# Patient Record
Sex: Female | Born: 1950
Health system: Southern US, Community
[De-identification: ages and names within clinical notes are randomized; demographics above are authoritative.]

## PROBLEM LIST (undated history)

## (undated) DIAGNOSIS — J189 Pneumonia, unspecified organism: Secondary | ICD-10-CM

## (undated) HISTORY — DX: Pneumonia, unspecified organism: J18.9

## (undated) HISTORY — PX: TONSILLECTOMY: SUR1361

---

## 1999-07-04 ENCOUNTER — Encounter (INDEPENDENT_AMBULATORY_CARE_PROVIDER_SITE_OTHER): Payer: Self-pay | Admitting: Specialist

## 1999-07-04 ENCOUNTER — Other Ambulatory Visit: Admission: RE | Admit: 1999-07-04 | Discharge: 1999-07-04 | Payer: Self-pay | Admitting: Obstetrics and Gynecology

## 2000-05-02 ENCOUNTER — Encounter: Payer: Self-pay | Admitting: Obstetrics and Gynecology

## 2000-05-02 ENCOUNTER — Ambulatory Visit (HOSPITAL_COMMUNITY): Admission: RE | Admit: 2000-05-02 | Discharge: 2000-05-02 | Payer: Self-pay | Admitting: Obstetrics and Gynecology

## 2000-05-07 ENCOUNTER — Encounter: Payer: Self-pay | Admitting: Obstetrics and Gynecology

## 2000-05-07 ENCOUNTER — Encounter: Admission: RE | Admit: 2000-05-07 | Discharge: 2000-05-07 | Payer: Self-pay | Admitting: Obstetrics and Gynecology

## 2004-03-14 ENCOUNTER — Ambulatory Visit (HOSPITAL_COMMUNITY): Admission: RE | Admit: 2004-03-14 | Discharge: 2004-03-14 | Payer: Self-pay | Admitting: Gastroenterology

## 2004-03-14 ENCOUNTER — Encounter (INDEPENDENT_AMBULATORY_CARE_PROVIDER_SITE_OTHER): Payer: Self-pay | Admitting: Specialist

## 2006-08-17 ENCOUNTER — Emergency Department (HOSPITAL_COMMUNITY): Admission: EM | Admit: 2006-08-17 | Discharge: 2006-08-17 | Payer: Self-pay | Admitting: Emergency Medicine

## 2007-04-26 ENCOUNTER — Emergency Department (HOSPITAL_COMMUNITY): Admission: EM | Admit: 2007-04-26 | Discharge: 2007-04-26 | Payer: Self-pay | Admitting: Emergency Medicine

## 2008-09-06 ENCOUNTER — Encounter: Payer: Self-pay | Admitting: Sports Medicine

## 2008-09-06 ENCOUNTER — Ambulatory Visit: Payer: Self-pay | Admitting: Sports Medicine

## 2008-09-06 DIAGNOSIS — IMO0002 Reserved for concepts with insufficient information to code with codable children: Secondary | ICD-10-CM | POA: Insufficient documentation

## 2009-05-11 ENCOUNTER — Emergency Department (HOSPITAL_COMMUNITY): Admission: EM | Admit: 2009-05-11 | Discharge: 2009-05-11 | Payer: Self-pay | Admitting: Family Medicine

## 2010-08-03 ENCOUNTER — Ambulatory Visit (HOSPITAL_COMMUNITY)
Admission: RE | Admit: 2010-08-03 | Discharge: 2010-08-03 | Payer: Self-pay | Source: Home / Self Care | Attending: Obstetrics and Gynecology | Admitting: Obstetrics and Gynecology

## 2011-01-05 NOTE — Op Note (Signed)
NAME:  Molly Sanders                         ACCOUNT NO.:  [id number]   MEDICAL RECORD NO.:  [id number]                   PATIENT TYPE:  AMB   LOCATION:  ENDO                                 FACILITY:  [facility]   PHYSICIAN:  John C. Madilyn Fireman, M.D.                 DATE OF BIRTH:  06-Nov-1950   DATE OF PROCEDURE:  03/14/2004  DATE OF DISCHARGE:                                 OPERATIVE REPORT   PROCEDURE:  Colonoscopy with polypectomy.   INDICATIONS FOR PROCEDURE:  Average risk colon cancer screening in a 60-year-  old patient with no previous screening.   PROCEDURE:  The patient was placed in the left lateral decubitus position  and placed on the pulse monitor with continuous low flow oxygen delivered by  nasal cannula.  She was sedated with 70 mcg of IV fentanyl, 9 mg of IV  Versed.  The Olympus video colonoscope was inserted into the rectum and  advanced to the cecum, confirmed by transillumination of McBurney's point  and visualization of the ileocecal valve and the appendiceal orifice.  The  prep was excellent.  The cecum, ascending, and transverse colon appeared  normal with no masses, polyps, diverticula or other mucosal abnormalities.  Within the descending colon, there was a 1 cm polyp on a short stalk which  was removed by snare.  The remainder of the descending, sigmoid and rectum  appeared normal down to the anus where retroflexed view revealed no obvious  internal hemorrhoids.  The scope was then withdrawn and the patient returned  to the recovery room in stable condition.  She tolerated the procedure well  and there were no immediate complications.   IMPRESSION:  Descending colon polyp otherwise normal study.   PLAN:  Await histology to determine method in interval for future colon  screening.                                               John C. Madilyn Fireman, M.D.    J[facility]/MEDQ  D:  03/14/2004  T:  03/14/2004  Job:  [id number]   cc:   Otilio Connors. Gerri Spore, M.D.  [address]  [place]  [place] [address]  Fax: [phone]

## 2011-06-01 LAB — POCT PREGNANCY, URINE
Operator id: 235561
Preg Test, Ur: NEGATIVE

## 2011-06-01 LAB — POCT URINALYSIS DIP (DEVICE)
Ketones, ur: NEGATIVE
Nitrite: NEGATIVE
Operator id: 235561
Protein, ur: NEGATIVE
Urobilinogen, UA: 0.2
pH: 7

## 2011-07-04 ENCOUNTER — Encounter: Payer: Self-pay | Admitting: *Deleted

## 2011-07-04 ENCOUNTER — Emergency Department (INDEPENDENT_AMBULATORY_CARE_PROVIDER_SITE_OTHER)
Admission: EM | Admit: 2011-07-04 | Discharge: 2011-07-04 | Disposition: A | Discharge status: Home / Self Care | Payer: 59 | Source: Home / Self Care | Attending: Family Medicine | Admitting: Family Medicine

## 2011-07-04 DIAGNOSIS — J069 Acute upper respiratory infection, unspecified: Secondary | ICD-10-CM

## 2011-07-04 MED ORDER — AZITHROMYCIN 250 MG PO TABS: ORAL_TABLET | ORAL | Status: AC

## 2011-07-04 NOTE — ED Provider Notes (Signed)
History     CSN: [id number] Arrival date & time: 07/04/2011  7:32 PM   First MD Initiated Contact with Patient 07/04/11 1929      Chief Complaint  Patient presents with  . Sore Throat    (Consider location/radiation/quality/duration/timing/severity/associated sxs/prior treatment) Patient is a 60 y.o. female presenting with pharyngitis. The history is provided by the patient.  Sore Throat This is a new problem. The current episode started more than 1 week ago. The problem occurs constantly. The problem has not changed since onset.Pertinent negatives include no shortness of breath. The symptoms are aggravated by swallowing and coughing.    History reviewed. No pertinent past medical history.  Past Surgical History  Procedure Date  . Tonsillectomy     Family History  Problem Relation Age of Onset  . Cancer Father     History  Substance Use Topics  . Smoking status: Never Smoker   . Smokeless tobacco: Not on file  . Alcohol Use: Yes     social    OB History    Grav Para Term Preterm Abortions TAB SAB Ect Mult Living                  Review of Systems  Constitutional: Negative.   HENT: Positive for congestion and rhinorrhea.   Eyes: Negative.   Respiratory: Positive for cough. Negative for shortness of breath.   Cardiovascular: Negative.     Allergies  Sulfur  Home Medications  No current outpatient prescriptions on file.  BP 130/60  Pulse 75  Temp(Src) 98.2 F (36.8 C) (Oral)  Resp 16  SpO2 100%  Physical Exam  Constitutional: She appears well-developed and well-nourished.  HENT:  Head: Normocephalic and atraumatic.  Right Ear: External ear normal.  Eyes: Conjunctivae and EOM are normal. Pupils are equal, round, and reactive to light.  Neck: Normal range of motion. Neck supple.  Cardiovascular: Normal rate, regular rhythm and normal heart sounds.   Pulmonary/Chest: Effort normal and breath sounds normal. No respiratory distress. She has no  wheezes. She has no rales.  Skin: Skin is warm and dry.    ED Course  Procedures (including critical care time)  Labs Reviewed - No data to display No results found.   No diagnosis found.    MDM          Barkley Bruns, MD 07/04/11 [phone]

## 2011-07-04 NOTE — ED Notes (Signed)
Reports  Symptoms  Of  sorethroat  Chills  Congested  Body  Aches  As  Well  As  Losing  Her  Voice   Symptoms  X  5  Days

## 2011-07-06 NOTE — ED Notes (Signed)
Call from patient, requesting Rx for yeast. Dr Artis Flock authorized diflucan 150 mg x 1 and MR in 4-5 days if no improvement; Called in to [place], [employer] at patient request, patient notified

## 2012-09-14 ENCOUNTER — Inpatient Hospital Stay (HOSPITAL_COMMUNITY)
Admission: EM | Admit: 2012-09-14 | Discharge: 2012-09-17 | DRG: 871 | Disposition: A | Discharge status: Home / Self Care | Payer: 59 | Attending: Internal Medicine | Admitting: Internal Medicine

## 2012-09-14 ENCOUNTER — Encounter (HOSPITAL_COMMUNITY): Payer: Self-pay | Admitting: *Deleted

## 2012-09-14 ENCOUNTER — Emergency Department (HOSPITAL_COMMUNITY): Payer: 59

## 2012-09-14 ENCOUNTER — Emergency Department (HOSPITAL_COMMUNITY)
Admission: EM | Admit: 2012-09-14 | Discharge: 2012-09-14 | Disposition: A | Discharge status: Nursing Home (Includes ICF) | Payer: 59 | Source: Home / Self Care

## 2012-09-14 DIAGNOSIS — J9601 Acute respiratory failure with hypoxia: Secondary | ICD-10-CM | POA: Diagnosis present

## 2012-09-14 DIAGNOSIS — R6520 Severe sepsis without septic shock: Secondary | ICD-10-CM | POA: Diagnosis present

## 2012-09-14 DIAGNOSIS — A419 Sepsis, unspecified organism: Principal | ICD-10-CM

## 2012-09-14 DIAGNOSIS — R6510 Systemic inflammatory response syndrome (SIRS) of non-infectious origin without acute organ dysfunction: Secondary | ICD-10-CM | POA: Diagnosis present

## 2012-09-14 DIAGNOSIS — R000 Tachycardia, unspecified: Secondary | ICD-10-CM

## 2012-09-14 DIAGNOSIS — R0600 Dyspnea, unspecified: Secondary | ICD-10-CM

## 2012-09-14 DIAGNOSIS — R509 Fever, unspecified: Secondary | ICD-10-CM

## 2012-09-14 DIAGNOSIS — R0902 Hypoxemia: Secondary | ICD-10-CM

## 2012-09-14 DIAGNOSIS — IMO0002 Reserved for concepts with insufficient information to code with codable children: Secondary | ICD-10-CM

## 2012-09-14 DIAGNOSIS — J9600 Acute respiratory failure, unspecified whether with hypoxia or hypercapnia: Secondary | ICD-10-CM | POA: Diagnosis present

## 2012-09-14 DIAGNOSIS — E876 Hypokalemia: Secondary | ICD-10-CM | POA: Diagnosis present

## 2012-09-14 DIAGNOSIS — E873 Alkalosis: Secondary | ICD-10-CM

## 2012-09-14 DIAGNOSIS — R0989 Other specified symptoms and signs involving the circulatory and respiratory systems: Secondary | ICD-10-CM

## 2012-09-14 DIAGNOSIS — R0609 Other forms of dyspnea: Secondary | ICD-10-CM

## 2012-09-14 DIAGNOSIS — R0689 Other abnormalities of breathing: Secondary | ICD-10-CM

## 2012-09-14 DIAGNOSIS — J189 Pneumonia, unspecified organism: Secondary | ICD-10-CM | POA: Diagnosis present

## 2012-09-14 LAB — CBC WITH DIFFERENTIAL/PLATELET
Basophils Absolute: 0 10*3/uL (ref 0.0–0.1)
Basophils Relative: 0 % (ref 0–1)
Eosinophils Relative: 0 % (ref 0–5)
Lymphocytes Relative: 7 % — ABNORMAL LOW (ref 12–46)
MCHC: 34.1 g/dL (ref 30.0–36.0)
Neutro Abs: 10.1 10*3/uL — ABNORMAL HIGH (ref 1.7–7.7)
Platelets: 141 10*3/uL — ABNORMAL LOW (ref 150–400)
RDW: 13 % (ref 11.5–15.5)
WBC: 12.7 10*3/uL — ABNORMAL HIGH (ref 4.0–10.5)

## 2012-09-14 LAB — URINALYSIS, ROUTINE W REFLEX MICROSCOPIC
Bilirubin Urine: NEGATIVE
Hgb urine dipstick: NEGATIVE
Nitrite: NEGATIVE
Protein, ur: NEGATIVE mg/dL
Specific Gravity, Urine: 1.007 (ref 1.005–1.030)
Urobilinogen, UA: 0.2 mg/dL (ref 0.0–1.0)

## 2012-09-14 LAB — POCT RAPID STREP A: Streptococcus, Group A Screen (Direct): NEGATIVE

## 2012-09-14 LAB — BASIC METABOLIC PANEL
BUN: 8 mg/dL (ref 6–23)
Calcium: 8.2 mg/dL — ABNORMAL LOW (ref 8.4–10.5)
Creatinine, Ser: 0.59 mg/dL (ref 0.50–1.10)
GFR calc Af Amer: >90 mL/min (ref >90)
GFR calc non Af Amer: >90 mL/min (ref >90)

## 2012-09-14 LAB — POCT I-STAT 3, VENOUS BLOOD GAS (G3P V)
Acid-Base Excess: 4 mmol/L — ABNORMAL HIGH (ref 0.0–2.0)
Bicarbonate: 25.6 mEq/L — ABNORMAL HIGH (ref 20.0–24.0)
TCO2: 26 mmol/L (ref 0–100)
pH, Ven: 7.542 — ABNORMAL HIGH (ref 7.250–7.300)

## 2012-09-14 LAB — URINE MICROSCOPIC-ADD ON

## 2012-09-14 LAB — TROPONIN I: Troponin I: <0.3 ng/mL (ref <0.30)

## 2012-09-14 MED ORDER — SODIUM CHLORIDE 0.9 % IV SOLN
Freq: Once | INTRAVENOUS | Status: AC
  Administered 2012-09-14: 17:00:00 via INTRAVENOUS

## 2012-09-14 MED ORDER — MAGNESIUM SULFATE 40 MG/ML IJ SOLN
2.0000 g | Freq: Once | INTRAMUSCULAR | Status: AC
  Administered 2012-09-14: 2 g via INTRAVENOUS
  Filled 2012-09-14: qty 50

## 2012-09-14 MED ORDER — AZITHROMYCIN 250 MG PO TABS
500.0000 mg | ORAL_TABLET | Freq: Once | ORAL | Status: AC
  Administered 2012-09-14: 500 mg via ORAL
  Filled 2012-09-14: qty 2

## 2012-09-14 MED ORDER — ACETAMINOPHEN 325 MG PO TABS
650.0000 mg | ORAL_TABLET | Freq: Once | ORAL | Status: AC
  Administered 2012-09-14: 650 mg via ORAL
  Filled 2012-09-14: qty 2

## 2012-09-14 MED ORDER — POTASSIUM CHLORIDE CRYS ER 20 MEQ PO TBCR
60.0000 meq | EXTENDED_RELEASE_TABLET | Freq: Once | ORAL | Status: AC
  Administered 2012-09-14: 60 meq via ORAL
  Filled 2012-09-14: qty 3

## 2012-09-14 MED ORDER — ACETAMINOPHEN 325 MG PO TABS
650.0000 mg | ORAL_TABLET | Freq: Once | ORAL | Status: AC
  Administered 2012-09-14: 650 mg via ORAL

## 2012-09-14 MED ORDER — ACETAMINOPHEN 325 MG PO TABS
ORAL_TABLET | ORAL | Status: AC
  Filled 2012-09-14: qty 2

## 2012-09-14 MED ORDER — DEXTROSE 5 % IV SOLN
1.0000 g | Freq: Once | INTRAVENOUS | Status: AC
  Administered 2012-09-14: 1 g via INTRAVENOUS
  Filled 2012-09-14: qty 10

## 2012-09-14 MED ORDER — SODIUM CHLORIDE 0.9 % IV BOLUS (SEPSIS)
2000.0000 mL | Freq: Once | INTRAVENOUS | Status: AC
  Administered 2012-09-14: 2000 mL via INTRAVENOUS

## 2012-09-14 NOTE — ED Provider Notes (Signed)
History     CSN: [id number]  Arrival date & time 09/14/12  1750   First MD Initiated Contact with Patient 09/14/12 1826      Chief Complaint  Patient presents with  . Fever  . Tachycardia    (Consider location/radiation/quality/duration/timing/severity/associated sxs/prior treatment) Patient is a 62 y.o. female presenting with fever.  Fever Primary symptoms of the febrile illness include fever, cough and shortness of breath. Primary symptoms do not include wheezing, abdominal pain, nausea, vomiting or diarrhea.    History reviewed. No pertinent past medical history.  Past Surgical History  Procedure Date  . Tonsillectomy     Family History  Problem Relation Age of Onset  . Cancer Father     History  Substance Use Topics  . Smoking status: Never Smoker   . Smokeless tobacco: Not on file  . Alcohol Use: Yes     Comment: social    OB History    Grav Para Term Preterm Abortions TAB SAB Ect Mult Living                  Review of Systems  Constitutional: Positive for fever. Negative for activity change and unexpected weight change.  HENT: Negative for facial swelling and neck pain.   Respiratory: Positive for cough and shortness of breath. Negative for wheezing.   Cardiovascular: Negative for chest pain.  Gastrointestinal: Negative for nausea, vomiting, abdominal pain, diarrhea, constipation, blood in stool and abdominal distention.  Genitourinary: Negative for hematuria and difficulty urinating.  Skin: Negative for color change.  Neurological: Negative for speech difficulty.  Hematological: Does not bruise/bleed easily.  Psychiatric/Behavioral: Negative for confusion.    Allergies  Sulfur  Home Medications   Current Outpatient Rx  Name  Route  Sig  Dispense  Refill  . JUICE PLUS FIBRE PO   Oral   Take 1 tablet by mouth daily.           BP 128/62  Pulse 102  Temp 99.8 F (37.7 C) (Oral)  Resp 18  SpO2 93%  Physical Exam  Nursing note and  vitals reviewed. Constitutional: She is oriented to person, place, and time. She appears well-developed.  HENT:  Head: Normocephalic and atraumatic.  Eyes: Conjunctivae normal and EOM are normal. Pupils are equal, round, and reactive to light.  Neck: Normal range of motion. Neck supple. No JVD present.  Cardiovascular: Normal rate, regular rhythm and normal heart sounds.   Pulmonary/Chest: Effort normal and breath sounds normal. No respiratory distress.  Abdominal: Soft. Bowel sounds are normal. She exhibits no distension. There is no tenderness. There is no rebound and no guarding.  Neurological: She is alert and oriented to person, place, and time.  Skin: Skin is warm and dry.    ED Course  Procedures (including critical care time)  Labs Reviewed  BASIC METABOLIC PANEL - Abnormal; Notable for the following:    Potassium 2.8 (*)     Glucose, Bld 149 (*)     Calcium 8.2 (*)     All other components within normal limits  CBC WITH DIFFERENTIAL - Abnormal; Notable for the following:    WBC 12.7 (*)     Platelets 141 (*)     Neutrophils Relative 79 (*)     Neutro Abs 10.1 (*)     Lymphocytes Relative 7 (*)     Monocytes Relative 14 (*)     Monocytes Absolute 1.8 (*)     All other components within normal limits  URINALYSIS,  ROUTINE W REFLEX MICROSCOPIC - Abnormal; Notable for the following:    Leukocytes, UA TRACE (*)     All other components within normal limits  POCT I-STAT 3, BLOOD GAS (G3P V) - Abnormal; Notable for the following:    pH, Ven 7.542 (*)     pCO2, Ven 29.8 (*)     pO2, Ven 149.0 (*)     Bicarbonate 25.6 (*)     Acid-Base Excess 4.0 (*)     All other components within normal limits  TROPONIN I  LACTIC ACID, PLASMA  URINE MICROSCOPIC-ADD ON  URINE CULTURE   Dg Chest 2 View  09/14/2012  *RADIOLOGY REPORT*  Clinical Data: 62 year old female with chest pain, cough and fever.  CHEST - 2 VIEW  Comparison: None  Findings: The cardiomediastinal silhouette is  unremarkable. Left lower lobe opacity is suspicious for pneumonia. There is no evidence of pleural effusion, pneumothorax, pulmonary edema, or mass. No acute or suspicious bony abnormalities are present.  IMPRESSION: Left lower lobe opacity suspicious for pneumonia.  Radiographic follow up to resolution recommended.   Original Report Authenticated By: Harmon Pier, M.D.      No diagnosis found.    MDM   Date: 09/14/2012  Rate: 95  Rhythm: normal sinus rhythm  QRS Axis: normal  Intervals: normal  ST/T Wave abnormalities: normal  Conduction Disutrbances:none  Narrative Interpretation:   Old EKG Reviewed: none available - U WAVES SEEN.  Pt comes in with cc of cough, fevers. Pt has 4/4 SIRS criteria initially. CXR shows possible infiltrate on the left side and she is slightly hypoxic. Sheh as no risk factors for PE, and given the fevers, cough - PE is low on the ddx, and this is considered more to be an infectious process.  Her lactate was < 2. And with hydration her HR and RR has improved. ABG was drawn by our techs - and it shows mixed metabolic and respiratory alkalosis and K is 2.4. She has u waves. No GI loss, No diuretics.  Pt is seen by Dr. Kevan Ny. Her PSI score is 61, and she is fit for outpatient therapy, especially since she has no co morbidities that are concerning.  Pt's O2 sats are 93% on room air. Pt's O2 sats 85% on ambulation.  Dr. Onalee Hua admitting.   CRITICAL CARE Performed by: Derwood Kaplan   Total critical care time: 30 minutes  Critical care time was exclusive of separately billable procedures and treating other patients.  Critical care was necessary to treat or prevent imminent or life-threatening deterioration.  Critical care was time spent personally by me on the following activities: development of treatment plan with patient and/or surrogate as well as nursing, discussions with consultants, evaluation of patient's response to treatment, examination of  patient, obtaining history from patient or surrogate, ordering and performing treatments and interventions, ordering and review of laboratory studies, ordering and review of radiographic studies, pulse oximetry and re-evaluation of patient's condition.       Derwood Kaplan, MD 09/15/12 [phone]

## 2012-09-14 NOTE — ED Notes (Addendum)
Pt placed on 2L O2 by [place] due to low spO2 (88%) when roomed.

## 2012-09-14 NOTE — ED Notes (Signed)
Patient complains of chest congestion, cough, nausea, sore throat, and shortness of breath, fatigue x 4 days. Denies vomiting, diarrhea.

## 2012-09-14 NOTE — ED Notes (Signed)
Pt. Informed of need to urinate, pt can not at this time.

## 2012-09-14 NOTE — ED Provider Notes (Signed)
History     CSN: [id number]  Arrival date & time 09/14/12  1359   First MD Initiated Contact with Patient 09/14/12 1627      Chief Complaint  Patient presents with  . URI    (Consider location/radiation/quality/duration/timing/severity/associated sxs/prior treatment) HPI Comments: 62 year old female presents with 2 days history of runny nose. Today she developed chest pain, malaise and sore throat. She is complaining of bodyaches, cough, fever. She states she has had no fluid intake today. Although she did not complain of shortness of breath she is tachypneic with a low pulse oximetry at 88% room air. Vital signs are noted.   History reviewed. No pertinent past medical history.  Past Surgical History  Procedure Date  . Tonsillectomy     Family History  Problem Relation Age of Onset  . Cancer Father     History  Substance Use Topics  . Smoking status: Never Smoker   . Smokeless tobacco: Not on file  . Alcohol Use: Yes     Comment: social    OB History    Grav Para Term Preterm Abortions TAB SAB Ect Mult Living                  Review of Systems  Constitutional: Positive for fever, chills, activity change, appetite change and fatigue.  HENT: Positive for rhinorrhea. Negative for ear pain, sore throat, neck pain and neck stiffness.   Respiratory: Positive for cough and wheezing.   Cardiovascular: Positive for chest pain.  Gastrointestinal: Negative.   Genitourinary: Negative.   Skin: Positive for pallor.  Neurological: Positive for light-headedness.  Psychiatric/Behavioral: Negative.     Allergies  Sulfur  Home Medications  No current outpatient prescriptions on file.  BP 130/57  Pulse 122  Temp 103.3 F (39.6 C) (Oral)  Resp 24  SpO2 91%  Physical Exam  Nursing note and vitals reviewed. Constitutional: She is oriented to person, place, and time. She appears well-developed and well-nourished. She appears distressed.       Patient is  extremely  weak. She appears quite ILL.  HENT:       Bilateral TMs with mild retraction otherwise normal. Oropharynx without erythema or exudates.   Eyes: EOM are normal.  Neck: Normal range of motion. Neck supple.  Cardiovascular: Regular rhythm.   No murmur heard.      Tachycardia  Pulmonary/Chest:       Mild increase in respiratory effort and tachypnea. Lungs with diffuse wheezes, rhonchi and rales.  Lymphadenopathy:    She has no cervical adenopathy.  Neurological: She is alert and oriented to person, place, and time.  Skin: Skin is warm. There is pallor.  Psychiatric: She has a normal mood and affect.    ED Course  Procedures (including critical care time)   Labs Reviewed  POCT RAPID STREP A ([facility] CARE ONLY)   No results found.   1. Hypoxia   2. Dyspnea   3. Fever and chills   4. Tachycardia   5. Abnormal breath sounds       MDM  62 year old female with fever 103.3. Tachycardia at 122, pulse ox of 88% on room air is being transported to the emergency department. Her lungs with bilateral scattered rhonchi, crackles in suspicion of lower lobe pulmonary edema. She is placed on oxygen at 2 L with a improved sat of 92%. It has since been raised to 4 L per minute. She is complaining of chest pain, malaise, bodyaches and cough. She has  had poor to no by mouth fluid intake today.         Hayden Rasmussen, NP 09/14/12 1708

## 2012-09-14 NOTE — ED Notes (Signed)
Per [facility]- pt has been having nausea, faver, headache and sorethroat since Thursday. Pt seen at urgent care today transferred due to HR in 122 and O2 sats at 88% on room air. Pt now on 2L with hr 106 and O2 at 96%. Pt had 103.3 fever and was given 650mg  of tylenol at urgent care.

## 2012-09-15 ENCOUNTER — Encounter (HOSPITAL_COMMUNITY): Payer: Self-pay | Admitting: *Deleted

## 2012-09-15 DIAGNOSIS — R6510 Systemic inflammatory response syndrome (SIRS) of non-infectious origin without acute organ dysfunction: Secondary | ICD-10-CM | POA: Diagnosis present

## 2012-09-15 DIAGNOSIS — J189 Pneumonia, unspecified organism: Secondary | ICD-10-CM | POA: Diagnosis present

## 2012-09-15 DIAGNOSIS — E876 Hypokalemia: Secondary | ICD-10-CM | POA: Diagnosis present

## 2012-09-15 DIAGNOSIS — J9601 Acute respiratory failure with hypoxia: Secondary | ICD-10-CM | POA: Diagnosis present

## 2012-09-15 DIAGNOSIS — J9600 Acute respiratory failure, unspecified whether with hypoxia or hypercapnia: Secondary | ICD-10-CM

## 2012-09-15 LAB — LEGIONELLA ANTIGEN, URINE: Legionella Antigen, Urine: NEGATIVE

## 2012-09-15 LAB — BASIC METABOLIC PANEL
BUN: 7 mg/dL (ref 6–23)
Chloride: 103 mEq/L (ref 96–112)
Creatinine, Ser: 0.48 mg/dL — ABNORMAL LOW (ref 0.50–1.10)
GFR calc Af Amer: >90 mL/min (ref >90)

## 2012-09-15 LAB — CBC WITH DIFFERENTIAL/PLATELET
Basophils Absolute: 0 10*3/uL (ref 0.0–0.1)
Basophils Relative: 0 % (ref 0–1)
Eosinophils Absolute: 0 10*3/uL (ref 0.0–0.7)
Eosinophils Relative: 0 % (ref 0–5)
HCT: 36.4 % (ref 36.0–46.0)
MCHC: 34.3 g/dL (ref 30.0–36.0)
MCV: 89.7 fL (ref 78.0–100.0)
Monocytes Absolute: 1.3 10*3/uL — ABNORMAL HIGH (ref 0.1–1.0)
Neutro Abs: 9.7 10*3/uL — ABNORMAL HIGH (ref 1.7–7.7)
RDW: 13.3 % (ref 11.5–15.5)

## 2012-09-15 LAB — CHLORIDE, URINE, RANDOM: Chloride Urine: <25 mEq/L

## 2012-09-15 LAB — URINE CULTURE: Colony Count: 50000

## 2012-09-15 MED ORDER — CEFTRIAXONE SODIUM 1 G IJ SOLR
1.0000 g | Freq: Every day | INTRAMUSCULAR | Status: DC
  Administered 2012-09-16 (×2): 1 g via INTRAVENOUS
  Filled 2012-09-15 (×3): qty 10

## 2012-09-15 MED ORDER — SODIUM CHLORIDE 0.9 % IV SOLN: INTRAVENOUS | Status: DC

## 2012-09-15 MED ORDER — IPRATROPIUM BROMIDE 0.02 % IN SOLN
0.5000 mg | Freq: Four times a day (QID) | RESPIRATORY_TRACT | Status: DC
  Administered 2012-09-15 – 2012-09-16 (×3): 0.5 mg via RESPIRATORY_TRACT
  Filled 2012-09-15 (×3): qty 2.5

## 2012-09-15 MED ORDER — ENOXAPARIN SODIUM 40 MG/0.4ML ~~LOC~~ SOLN
40.0000 mg | Freq: Every day | SUBCUTANEOUS | Status: DC
  Administered 2012-09-15 – 2012-09-16 (×2): 40 mg via SUBCUTANEOUS
  Filled 2012-09-15 (×3): qty 0.4

## 2012-09-15 MED ORDER — GUAIFENESIN-DM 100-10 MG/5ML PO SYRP
5.0000 mL | ORAL_SOLUTION | ORAL | Status: DC | PRN
  Administered 2012-09-15 (×3): 5 mL via ORAL
  Filled 2012-09-15 (×3): qty 5

## 2012-09-15 MED ORDER — DEXTROSE 5 % IV SOLN
500.0000 mg | Freq: Every day | INTRAVENOUS | Status: DC
  Administered 2012-09-15 – 2012-09-16 (×2): 500 mg via INTRAVENOUS
  Filled 2012-09-15 (×3): qty 500

## 2012-09-15 MED ORDER — ALBUTEROL SULFATE (5 MG/ML) 0.5% IN NEBU: 2.5000 mg | INHALATION_SOLUTION | RESPIRATORY_TRACT | Status: DC | PRN

## 2012-09-15 MED ORDER — GUAIFENESIN ER 600 MG PO TB12
1200.0000 mg | ORAL_TABLET | Freq: Two times a day (BID) | ORAL | Status: DC
  Administered 2012-09-15 – 2012-09-17 (×4): 1200 mg via ORAL
  Filled 2012-09-15 (×6): qty 2

## 2012-09-15 MED ORDER — ALBUTEROL SULFATE (5 MG/ML) 0.5% IN NEBU
2.5000 mg | INHALATION_SOLUTION | Freq: Four times a day (QID) | RESPIRATORY_TRACT | Status: DC
  Administered 2012-09-15 – 2012-09-16 (×3): 2.5 mg via RESPIRATORY_TRACT
  Filled 2012-09-15 (×3): qty 0.5

## 2012-09-15 MED ORDER — OXYCODONE-ACETAMINOPHEN 5-325 MG PO TABS: 1.0000 | ORAL_TABLET | Freq: Four times a day (QID) | ORAL | Status: DC | PRN

## 2012-09-15 MED ORDER — ONDANSETRON HCL 4 MG/2ML IJ SOLN: 4.0000 mg | Freq: Three times a day (TID) | INTRAMUSCULAR | Status: DC | PRN

## 2012-09-15 MED ORDER — POTASSIUM CHLORIDE IN NACL 20-0.9 MEQ/L-% IV SOLN
INTRAVENOUS | Status: AC
  Administered 2012-09-15: 03:00:00 via INTRAVENOUS
  Filled 2012-09-15 (×2): qty 1000

## 2012-09-15 MED ORDER — ACETAMINOPHEN 325 MG PO TABS
650.0000 mg | ORAL_TABLET | Freq: Four times a day (QID) | ORAL | Status: DC | PRN
  Administered 2012-09-15 (×2): 650 mg via ORAL
  Filled 2012-09-15 (×2): qty 2

## 2012-09-15 NOTE — Progress Notes (Signed)
   Patient seen earlier today by my colleague Dr. Onalee Hua.  Patient seen and examined, and data base reviewed.  Acute hypoxic respiratory failure secondary to community-acquired pneumonia.  Patient required oxygen to keep her oxygen saturation above 90%.  Continue IV antibiotics, mucolytics, antitussive and oxygen as needed.  Clint Lipps Pager: [phone] 09/15/2012, 3:36 PM

## 2012-09-15 NOTE — ED Provider Notes (Signed)
Medical screening examination/treatment/procedure(s) were performed by resident physician or non-physician practitioner and as supervising physician I was immediately available for consultation/collaboration.   Barkley Bruns MD.    Linna Hoff, MD 09/15/12 [phone]

## 2012-09-15 NOTE — Progress Notes (Signed)
CrCl 66 ml/min.  Antibiotic (Ceftriaxone, azithromycin) dosing OK.  Pharmacy will sign off. Please advise if we can be of further assistance. Thanks for allowing pharmacy to be a part of this patient's care.  Talbert Cage, PharmD Clinical Pharmacist, [phone]

## 2012-09-15 NOTE — H&P (Signed)
  PCP:   Hollice Espy, MD   Chief Complaint:  Cough fever  HPI: 62 yo healthy female comes in with several days of cough, fever, myalgias.  Along with sob.  Nonsmoker.  No recent abx use.  Found to be hypoxic in ED and pna on cxr.  No mental status changes.  Review of Systems:  O/w neg  Past Medical History: History reviewed. No pertinent past medical history. Past Surgical History  Procedure Date  . Tonsillectomy     Medications: Prior to Admission medications   Medication Sig Start Date End Date Taking? Authorizing Provider  Nutritional Supplements (JUICE PLUS FIBRE PO) Take 1 tablet by mouth daily.   Yes Historical Provider, MD    Allergies:   Allergies  Allergen Reactions  . Sulfur     itching    Social History:  reports that she has never smoked. She does not have any smokeless tobacco history on file. She reports that she drinks alcohol. She reports that she does not use illicit drugs.  Family History: Family History  Problem Relation Age of Onset  . Cancer Father     Physical Exam: Filed Vitals:   09/14/12 1752 09/14/12 1819 09/14/12 1838  BP: 118/62 128/62   Pulse: 102 102   Temp: 102.3 F (39.1 C)  99.8 F (37.7 C)  TempSrc: Oral  Oral  Resp: 18    SpO2: 92% 93%    General appearance: alert, cooperative and no distress Neck: no JVD and supple, symmetrical, trachea midline Lungs: rhonchi bilaterally Heart: regular rate and rhythm, S1, S2 normal, no murmur, click, rub or gallop Abdomen: soft, non-tender; bowel sounds normal; no masses,  no organomegaly Extremities: extremities normal, atraumatic, no cyanosis or edema Pulses: 2+ and symmetric Skin: Skin color, texture, turgor normal. No rashes or lesions Neurologic: Grossly normal    Labs on Admission:   [facility] 09/14/12 1915  NA 135  K 2.8*  CL 97  CO2 24  GLUCOSE 149*  BUN 8  CREATININE 0.59  CALCIUM 8.2*  MG --  PHOS --    Basename 09/14/12 1915  WBC 12.7*  NEUTROABS  10.1*  HGB 12.7  HCT 37.2  MCV 89.0  PLT 141*    Basename 09/14/12 1929  CKTOTAL --  CKMB --  CKMBINDEX --  TROPONINI <0.30   Radiological Exams on Admission: Dg Chest 2 View  09/14/2012  *RADIOLOGY REPORT*  Clinical Data: 62 year old female with chest pain, cough and fever.  CHEST - 2 VIEW  Comparison: None  Findings: The cardiomediastinal silhouette is unremarkable. Left lower lobe opacity is suspicious for pneumonia. There is no evidence of pleural effusion, pneumothorax, pulmonary edema, or mass. No acute or suspicious bony abnormalities are present.  IMPRESSION: Left lower lobe opacity suspicious for pneumonia.  Radiographic follow up to resolution recommended.   Original Report Authenticated By: Harmon Pier, M.D.     Assessment/Plan 62 yo female with CAP and acute hypoxic resp failure mild  Principal Problem:  *Acute respiratory failure with hypoxia Active Problems:  PNA (pneumonia)  SIRS (systemic inflammatory response syndrome)  Hypokalemia  pna pathway.  Rocephin/azithro.  Oxygen supplementation.  Replace k in ivf overnight.  vss otherwise normal except for mild hypoxia.  Med bed.  Full code.  Danene Montijo A 09/15/2012, 12:49 AM

## 2012-09-15 NOTE — ED Notes (Signed)
Pt. Ambulated, pulse ox dropped to 84%. Dr. Rhunette Croft notified.

## 2012-09-16 LAB — BASIC METABOLIC PANEL
CO2: 23 mEq/L (ref 19–32)
Calcium: 8.8 mg/dL (ref 8.4–10.5)
Creatinine, Ser: 0.49 mg/dL — ABNORMAL LOW (ref 0.50–1.10)
Glucose, Bld: 109 mg/dL — ABNORMAL HIGH (ref 70–99)

## 2012-09-16 LAB — CBC
MCH: 30.4 pg (ref 26.0–34.0)
MCV: 89.4 fL (ref 78.0–100.0)
Platelets: 139 10*3/uL — ABNORMAL LOW (ref 150–400)
RDW: 13.6 % (ref 11.5–15.5)

## 2012-09-16 MED ORDER — POTASSIUM CHLORIDE CRYS ER 20 MEQ PO TBCR
40.0000 meq | EXTENDED_RELEASE_TABLET | Freq: Once | ORAL | Status: AC
  Administered 2012-09-16: 40 meq via ORAL
  Filled 2012-09-16: qty 2

## 2012-09-16 NOTE — Progress Notes (Signed)
[facility] PROGRESS NOTE  Molly Sanders ZOX:[id number] DOB: 1950-12-28 DOA: 09/14/2012 PCP: Hollice Espy, MD  HPI/Subjective: Still complaining about shortness of breath, nonproductive cough   Assessment/Plan:  Pneumonia -Community acquired pneumonia, started on IV Rocephin and azithromycin. -Continued mucolytics, antitussives, inhaled bronchodilators and oxygen as needed.  Acute respiratory failure with hypoxia -2/2 to CAP -Patient was hypoxic on room air, she needed at least 2 L of oxygen to keep her saturation above 90%. -Now on 2 L of oxygen via nasal cannula, weaned off of oxygen as tolerated.  Hypokalemia -Repleted with oral supplements, came in with potassium of 2.8, today is 3.6.  Code Status: Full code Family Communication:  Disposition Plan: Remain inpatient likely to be discharged in a.m.   Consultants:  None  Procedures:  None  Antibiotics:  Rocephin and azithromycin started on 09/15/2012.   Objective: Filed Vitals:   09/15/12 2057 09/15/12 2106 09/16/12 0546 09/16/12 0849  BP:  120/90 116/45   Pulse:  98 88   Temp:  99.5 F (37.5 C) 98.5 F (36.9 C)   TempSrc:  Oral Oral   Resp:  20 18   Height:      Weight:      SpO2: 98% 99% 99% 98%    Intake/Output Summary (Last 24 hours) at 09/16/12 1443 Last data filed at 09/16/12 0817  Gross per 24 hour  Intake    880 ml  Output   1000 ml  Net   -120 ml   Filed Weights   09/15/12 0236  Weight: 67.994 kg (149 lb 14.4 oz)    Exam: General: Alert and awake, oriented x3, not in any acute distress. HEENT: anicteric sclera, pupils reactive to light and accommodation, EOMI CVS: S1-S2 clear, no murmur rubs or gallops Chest: clear to auscultation bilaterally, no wheezing, rales or rhonchi Abdomen: soft nontender, nondistended, normal bowel sounds, no organomegaly Extremities: no cyanosis, clubbing or edema noted bilaterally Neuro: Cranial nerves II-XII intact, no focal neurological  deficits   Data Reviewed: Basic Metabolic Panel:  Lab 09/16/12 8119 09/15/12 0300 09/14/12 1915  NA 138 137 135  K 3.6 4.1 2.8*  CL 104 103 97  CO2 23 21 24   GLUCOSE 109* 110* 149*  BUN 7 7 8   CREATININE 0.49* 0.48* 0.59  CALCIUM 8.8 8.2* 8.2*  MG -- -- --  PHOS -- -- --   Liver Function Tests: No results found for this basename: AST:5,ALT:5,ALKPHOS:5,BILITOT:5,PROT:5,ALBUMIN:5 in the last 168 hours No results found for this basename: LIPASE:5,AMYLASE:5 in the last 168 hours No results found for this basename: AMMONIA:5 in the last 168 hours CBC:  Lab 09/16/12 0610 09/15/12 0300 09/14/12 1915  WBC 10.4 12.1* 12.7*  NEUTROABS -- 9.7* 10.1*  HGB 11.8* 12.5 12.7  HCT 34.7* 36.4 37.2  MCV 89.4 89.7 89.0  PLT 139* 133* 141*   Cardiac Enzymes:  Lab 09/14/12 1929  CKTOTAL --  CKMB --  CKMBINDEX --  TROPONINI <0.30   BNP (last 3 results) No results found for this basename: PROBNP:3 in the last 8760 hours CBG: No results found for this basename: GLUCAP:5 in the last 168 hours  Recent Results (from the past 240 hour(s))  URINE CULTURE     Status: Normal   Collection Time   09/14/12 10:35 PM      Component Value Range Status Comment   Specimen Description URINE, CLEAN CATCH   Final    Special Requests NONE   Final    Culture  Setup Time 09/15/2012  00:03   Final    Colony Count 50,000 COLONIES/ML   Final    Culture     Final    Value: Multiple bacterial morphotypes present, none predominant. Suggest appropriate recollection if clinically indicated.   Report Status 09/15/2012 FINAL   Final   CULTURE, BLOOD (ROUTINE X 2)     Status: Normal (Preliminary result)   Collection Time   09/15/12  3:20 AM      Component Value Range Status Comment   Specimen Description BLOOD LEFT ARM   Final    Special Requests BOTTLES DRAWN AEROBIC ONLY 1CC   Final    Culture  Setup Time 09/15/2012 08:51   Final    Culture     Final    Value:        BLOOD CULTURE RECEIVED NO GROWTH TO DATE  CULTURE WILL BE HELD FOR 5 DAYS BEFORE ISSUING A FINAL NEGATIVE REPORT   Report Status PENDING   Incomplete   CULTURE, BLOOD (ROUTINE X 2)     Status: Normal (Preliminary result)   Collection Time   09/15/12  3:35 AM      Component Value Range Status Comment   Specimen Description BLOOD LEFT ARM   Final    Special Requests BOTTLES DRAWN AEROBIC ONLY 1CC   Final    Culture  Setup Time 09/15/2012 08:51   Final    Culture     Final    Value:        BLOOD CULTURE RECEIVED NO GROWTH TO DATE CULTURE WILL BE HELD FOR 5 DAYS BEFORE ISSUING A FINAL NEGATIVE REPORT   Report Status PENDING   Incomplete      Studies: Dg Chest 2 View  09/14/2012  *RADIOLOGY REPORT*  Clinical Data: 62 year old female with chest pain, cough and fever.  CHEST - 2 VIEW  Comparison: None  Findings: The cardiomediastinal silhouette is unremarkable. Left lower lobe opacity is suspicious for pneumonia. There is no evidence of pleural effusion, pneumothorax, pulmonary edema, or mass. No acute or suspicious bony abnormalities are present.  IMPRESSION: Left lower lobe opacity suspicious for pneumonia.  Radiographic follow up to resolution recommended.   Original Report Authenticated By: Harmon Pier, M.D.     Scheduled Meds:   . azithromycin  500 mg Intravenous QHS  . cefTRIAXone (ROCEPHIN)  IV  1 g Intravenous QHS  . enoxaparin (LOVENOX) injection  40 mg Subcutaneous Daily  . guaiFENesin  1,200 mg Oral BID   Continuous Infusions:   Principal Problem:  *Acute respiratory failure with hypoxia Active Problems:  PNA (pneumonia)  SIRS (systemic inflammatory response syndrome)  Hypokalemia    Time spent: 35 minutes    [facility]  [facility] Pager [phone]. If 8PM-8AM, please contact night-coverage at [email or web address], password [facility] 09/16/2012, 2:43 PM  LOS: 2 days

## 2012-09-16 NOTE — Care Management Note (Signed)
  Page 1 of 1   09/16/2012     2:49:06 PM   CARE MANAGEMENT NOTE 09/16/2012  Patient:  Molly Sanders, Molly Sanders   Account Number:  [id number]  Date Initiated:  09/16/2012  Documentation initiated by:  Ronny Flurry  Subjective/Objective Assessment:   DX: PNA     Action/Plan:   Anticipated DC Date:     Anticipated DC Plan:  HOME/SELF CARE         Choice offered to / List presented to:             Status of service:  In process, will continue to follow Medicare Important Message given?   (If response is "NO", the following Medicare IM given date fields will be blank) Date Medicare IM given:   Date Additional Medicare IM given:    Discharge Disposition:    Per UR Regulation:  Reviewed for med. necessity/level of care/duration of stay  If discussed at Long Length of Stay Meetings, dates discussed:    Comments:

## 2012-09-17 LAB — BASIC METABOLIC PANEL
BUN: 7 mg/dL (ref 6–23)
CO2: 25 mEq/L (ref 19–32)
Calcium: 9.2 mg/dL (ref 8.4–10.5)
Creatinine, Ser: 0.51 mg/dL (ref 0.50–1.10)
GFR calc Af Amer: >90 mL/min (ref >90)

## 2012-09-17 MED ORDER — GUAIFENESIN ER 600 MG PO TB12: 1200.0000 mg | ORAL_TABLET | Freq: Two times a day (BID) | ORAL | Status: DC

## 2012-09-17 MED ORDER — LEVOFLOXACIN 750 MG PO TABS: 750.0000 mg | ORAL_TABLET | Freq: Every day | ORAL | Status: DC

## 2012-09-17 MED ORDER — FLUCONAZOLE 150 MG PO TABS
150.0000 mg | ORAL_TABLET | Freq: Once | ORAL | Status: AC
  Administered 2012-09-17: 150 mg via ORAL
  Filled 2012-09-17: qty 1

## 2012-09-17 NOTE — Discharge Summary (Signed)
Physician Discharge Summary  Molly Sanders ZOX:[id number] DOB: Oct 24, 1950 DOA: 09/14/2012  PCP: Hollice Espy, MD  Admit date: 09/14/2012 Discharge date: 09/17/2012  Time spent: 40 minutes  Recommendations for Outpatient Follow-up:  1. Follow up with PMD.  2.   Recommended return to regular duties on 09/23/12.  Discharge Diagnoses:  Principal Problem:  *Acute respiratory failure with hypoxia Active Problems:  PNA (pneumonia)  SIRS (systemic inflammatory response syndrome)  Hypokalemia   Discharge Condition: Satisfactory.   Diet recommendation: Regular.   Filed Weights   09/15/12 0236  Weight: 67.994 kg (149 lb 14.4 oz)    History of present illness:  62 yo healthy female comes in with several days of cough, fever, myalgias. Along with shortness of breath. Nonsmoker. No recent antibioti use. Found to be hypoxic in ED. CXR revealed LLL pneumonia. No mental status changes. Admitted for further management.    Hospital Course:  1. Pneumonia: Patient presented with several days of cough, fever, myalgias. Along with shortness of breath. CXR revealed left lower lobe opacity suspicious for pneumonia, and she had a leukocytosis of 12.7. These findings were consistent with a community acquired pneumonia. She was managed with iv Rocephin/Azithromycin, as well as mucolytics, antitussives, and supportive treatment, with satisfactory clinical response. No pyrexia was documented during hospitalization, and as of 09/17/12, wcc had normalized, cough was no longer productive and patient felt considerably better. She has been transitioned to oral Levaquin for a further 7 days of antibiotic therapy, to be concluded on 09/24/12.  2. Acute respiratory failure with hypoxia: Secondary to CAP. Patient was hypoxic on room air, at presentation, and responded to oxygen supplementation, as well as specific treatment described above. As of 09/17/12, she was saturating at 99% on RA.  3. Hypokalemia: Repleted as  indicated.    Procedures:  See below  Consultations:  N/A.   Discharge Exam: Filed Vitals:   09/16/12 0849 09/16/12 1508 09/16/12 2117 09/17/12 0502  BP:  117/51 97/63 126/61  Pulse:  89 82 76  Temp:  100.9 F (38.3 C) 98.9 F (37.2 C) 98.4 F (36.9 C)  TempSrc:  Oral Oral Oral  Resp:  18 18 16   Height:      Weight:      SpO2: 98% 99% 99% 99%    General: Comfortable, alert, communicative, fully oriented, not short of breath at rest.  HEENT:  No clinical pallor, no jaundice, no conjunctival injection or discharge. Hydration is satisfactory.  NECK:  Supple, JVP not seen, no carotid bruits, no palpable lymphadenopathy, no palpable goiter. CHEST:  Clinically clear to auscultation, no wheezes, no crackles. HEART:  Sounds 1 and 2 heard, normal, regular, no murmurs. ABDOMEN:  Full, soft, non-tender, no palpable organomegaly, no palpable masses, normal bowel sounds. GENITALIA:  Not examined. LOWER EXTREMITIES:  No pitting edema, palpable peripheral pulses. MUSCULOSKELETAL SYSTEM:  Unremarkable.  CENTRAL NERVOUS SYSTEM:  No focal neurologic deficit on gross examination.  Discharge Instructions      Discharge Orders    Future Orders Please Complete By Expires   Diet general      Increase activity slowly          Medication List     As of 09/17/2012 11:07 AM    TAKE these medications         guaiFENesin 600 MG 12 hr tablet   Commonly known as: MUCINEX   Take 2 tablets (1,200 mg total) by mouth 2 (two) times daily.      JUICE PLUS FIBRE  PO   Take 1 tablet by mouth daily.      levofloxacin 750 MG tablet   Commonly known as: LEVAQUIN   Take 1 tablet (750 mg total) by mouth daily.        Follow-up Information    Schedule an appointment as soon as possible for a visit with Hollice Espy, MD.   Contact information:   [address] [place] [address] [phone]           The results of significant diagnostics from this hospitalization  (including imaging, microbiology, ancillary and laboratory) are listed below for reference.    Significant Diagnostic Studies: Dg Chest 2 View  09/14/2012  *RADIOLOGY REPORT*  Clinical Data: 62 year old female with chest pain, cough and fever.  CHEST - 2 VIEW  Comparison: None  Findings: The cardiomediastinal silhouette is unremarkable. Left lower lobe opacity is suspicious for pneumonia. There is no evidence of pleural effusion, pneumothorax, pulmonary edema, or mass. No acute or suspicious bony abnormalities are present.  IMPRESSION: Left lower lobe opacity suspicious for pneumonia.  Radiographic follow up to resolution recommended.   Original Report Authenticated By: Harmon Pier, M.D.     Microbiology: Recent Results (from the past 240 hour(s))  URINE CULTURE     Status: Normal   Collection Time   09/14/12 10:35 PM      Component Value Range Status Comment   Specimen Description URINE, CLEAN CATCH   Final    Special Requests NONE   Final    Culture  Setup Time 09/15/2012 00:03   Final    Colony Count 50,000 COLONIES/ML   Final    Culture     Final    Value: Multiple bacterial morphotypes present, none predominant. Suggest appropriate recollection if clinically indicated.   Report Status 09/15/2012 FINAL   Final   CULTURE, BLOOD (ROUTINE X 2)     Status: Normal (Preliminary result)   Collection Time   09/15/12  3:20 AM      Component Value Range Status Comment   Specimen Description BLOOD LEFT ARM   Final    Special Requests BOTTLES DRAWN AEROBIC ONLY 1CC   Final    Culture  Setup Time 09/15/2012 08:51   Final    Culture     Final    Value:        BLOOD CULTURE RECEIVED NO GROWTH TO DATE CULTURE WILL BE HELD FOR 5 DAYS BEFORE ISSUING A FINAL NEGATIVE REPORT   Report Status PENDING   Incomplete   CULTURE, BLOOD (ROUTINE X 2)     Status: Normal (Preliminary result)   Collection Time   09/15/12  3:35 AM      Component Value Range Status Comment   Specimen Description BLOOD LEFT ARM    Final    Special Requests BOTTLES DRAWN AEROBIC ONLY 1CC   Final    Culture  Setup Time 09/15/2012 08:51   Final    Culture     Final    Value:        BLOOD CULTURE RECEIVED NO GROWTH TO DATE CULTURE WILL BE HELD FOR 5 DAYS BEFORE ISSUING A FINAL NEGATIVE REPORT   Report Status PENDING   Incomplete      Labs: Basic Metabolic Panel:  Lab 09/17/12 9147 09/16/12 0610 09/15/12 0300 09/14/12 1915  NA 142 138 137 135  K 4.1 3.6 4.1 2.8*  CL 106 104 103 97  CO2 25 23 21 24   GLUCOSE 94 109* 110* 149*  BUN 7 7 7 8   CREATININE 0.51 0.49* 0.48* 0.59  CALCIUM 9.2 8.8 8.2* 8.2*  MG -- -- -- --  PHOS -- -- -- --   Liver Function Tests: No results found for this basename: AST:5,ALT:5,ALKPHOS:5,BILITOT:5,PROT:5,ALBUMIN:5 in the last 168 hours No results found for this basename: LIPASE:5,AMYLASE:5 in the last 168 hours No results found for this basename: AMMONIA:5 in the last 168 hours CBC:  Lab 09/16/12 0610 09/15/12 0300 09/14/12 1915  WBC 10.4 12.1* 12.7*  NEUTROABS -- 9.7* 10.1*  HGB 11.8* 12.5 12.7  HCT 34.7* 36.4 37.2  MCV 89.4 89.7 89.0  PLT 139* 133* 141*   Cardiac Enzymes:  Lab 09/14/12 1929  CKTOTAL --  CKMB --  CKMBINDEX --  TROPONINI <0.30   BNP: BNP (last 3 results) No results found for this basename: PROBNP:3 in the last 8760 hours CBG: No results found for this basename: GLUCAP:5 in the last 168 hours     Signed:  Riad Wagley,CHRISTOPHER  [facility] 09/17/2012, 11:07 AM

## 2012-09-21 LAB — CULTURE, BLOOD (ROUTINE X 2): Culture: NO GROWTH

## 2012-11-14 ENCOUNTER — Other Ambulatory Visit (HOSPITAL_COMMUNITY): Payer: Self-pay | Admitting: Family Medicine

## 2012-11-14 ENCOUNTER — Ambulatory Visit (HOSPITAL_COMMUNITY)
Admission: RE | Admit: 2012-11-14 | Discharge: 2012-11-14 | Disposition: A | Discharge status: Home / Self Care | Payer: 59 | Source: Ambulatory Visit | Attending: Family Medicine | Admitting: Family Medicine

## 2012-11-14 DIAGNOSIS — R059 Cough, unspecified: Secondary | ICD-10-CM | POA: Insufficient documentation

## 2012-11-14 DIAGNOSIS — Z09 Encounter for follow-up examination after completed treatment for conditions other than malignant neoplasm: Secondary | ICD-10-CM | POA: Insufficient documentation

## 2012-11-14 DIAGNOSIS — J189 Pneumonia, unspecified organism: Secondary | ICD-10-CM | POA: Insufficient documentation

## 2012-11-14 DIAGNOSIS — R05 Cough: Secondary | ICD-10-CM | POA: Insufficient documentation

## 2014-02-25 IMAGING — CR DG CHEST 2V
2 series · 2 of 2 positions shown · non-contrast
Comparison: 09/14/2012

CLINICAL DATA: Follow up pneumonia, recent:  Cough

CHEST - 2 VIEW

[view not recorded (1 of 2)]
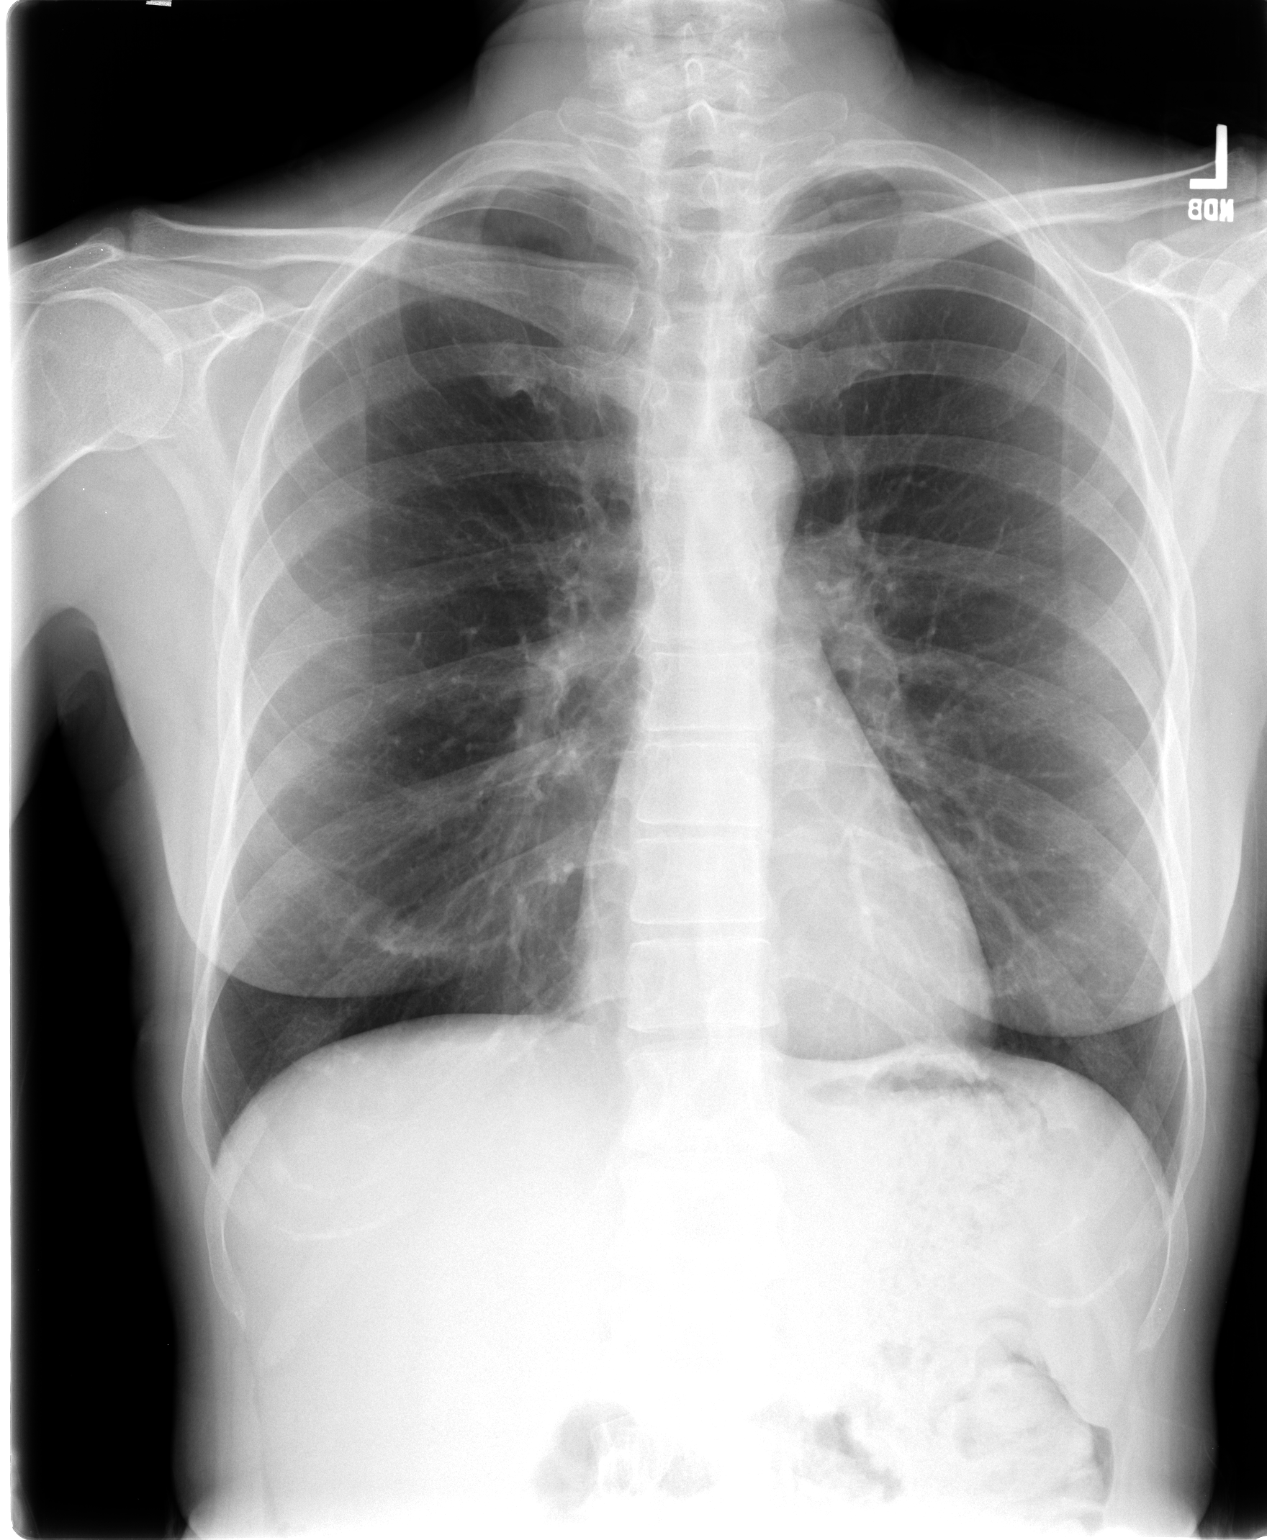

[view not recorded (2 of 2)]
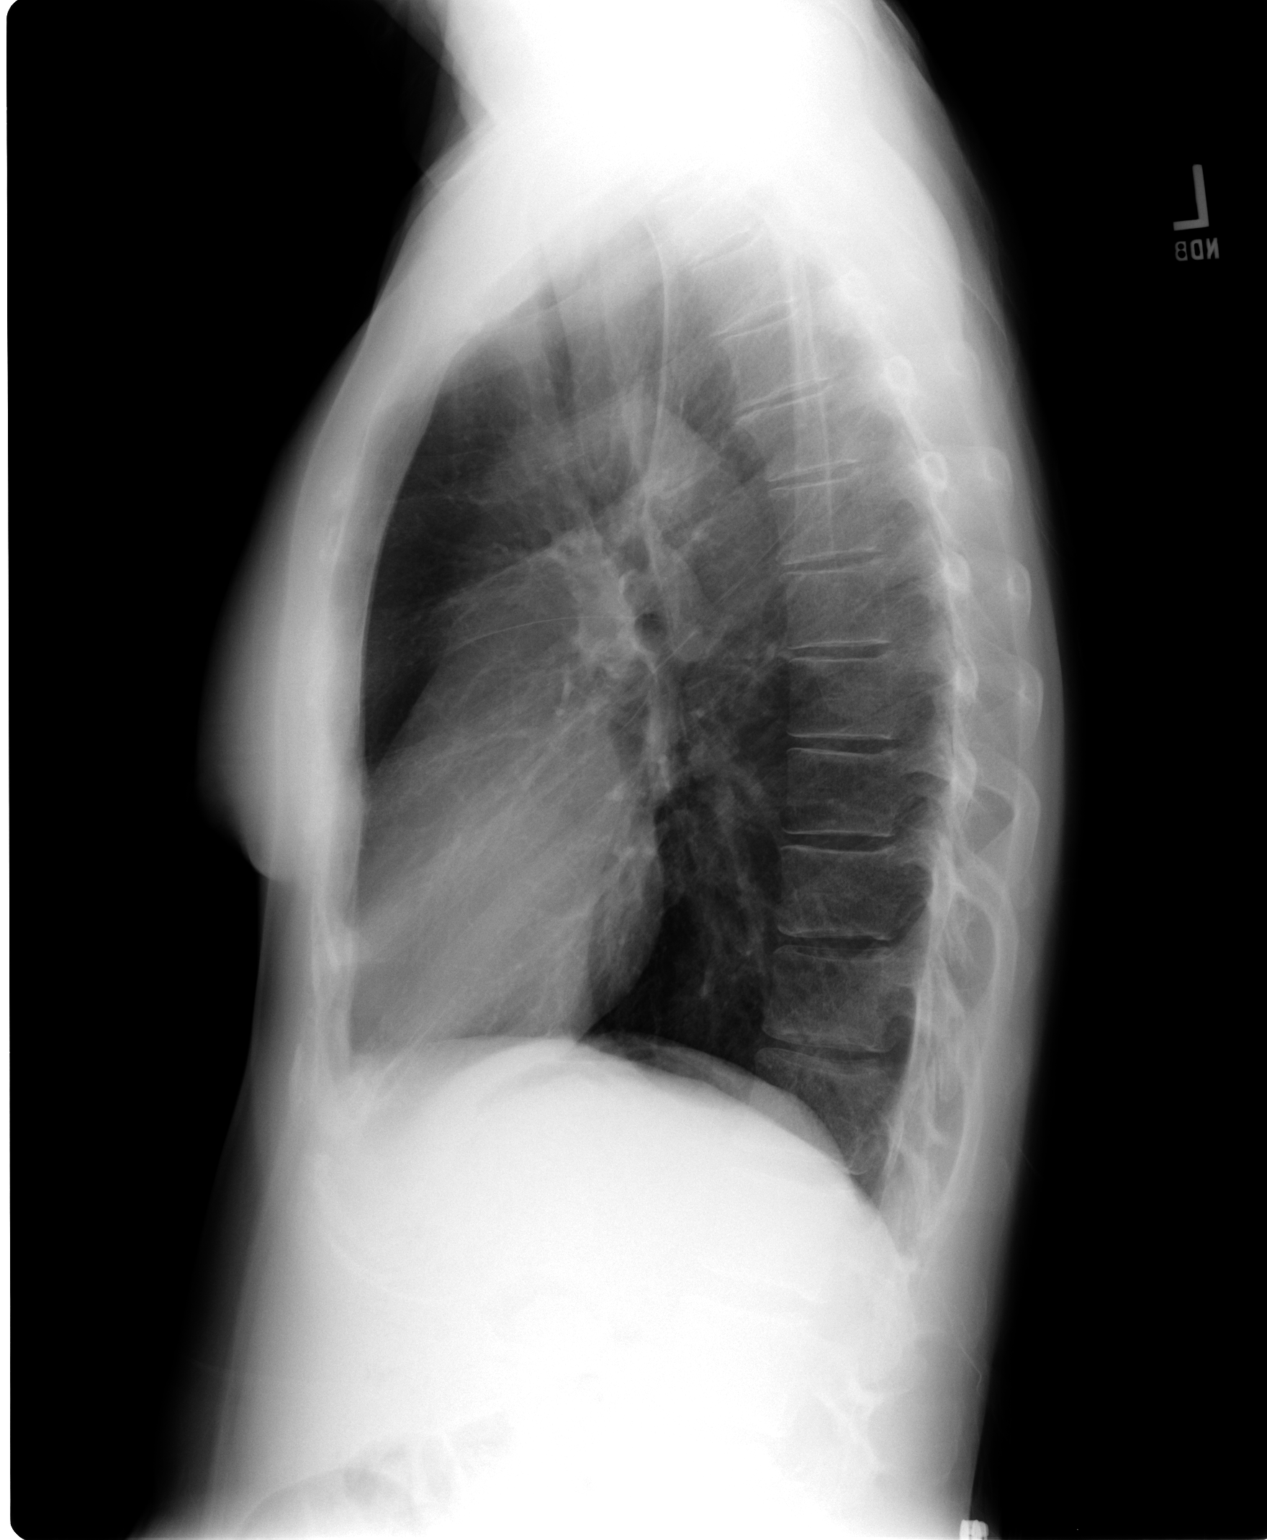

[2 of 2 positions shown; findings below may reference images not displayed]

FINDINGS: The heart and pulmonary vascularity are within normal
limits.  The lungs are clear bilaterally.  No acute bony
abnormality is seen.
IMPRESSION: No acute abnormality noted.

## 2014-09-08 ENCOUNTER — Other Ambulatory Visit: Payer: Self-pay | Admitting: Obstetrics and Gynecology

## 2014-09-13 LAB — CYTOLOGY - PAP

## 2015-12-16 DIAGNOSIS — Z13220 Encounter for screening for lipoid disorders: Secondary | ICD-10-CM | POA: Diagnosis not present

## 2015-12-16 DIAGNOSIS — Z0000 Encounter for general adult medical examination without abnormal findings: Secondary | ICD-10-CM | POA: Diagnosis not present

## 2015-12-19 DIAGNOSIS — Z0000 Encounter for general adult medical examination without abnormal findings: Secondary | ICD-10-CM | POA: Diagnosis not present

## 2015-12-19 DIAGNOSIS — M8580 Other specified disorders of bone density and structure, unspecified site: Secondary | ICD-10-CM | POA: Diagnosis not present

## 2016-09-14 ENCOUNTER — Ambulatory Visit (HOSPITAL_COMMUNITY)
Admission: EM | Admit: 2016-09-14 | Discharge: 2016-09-14 | Disposition: A | Discharge status: Home / Self Care | Payer: 59 | Attending: Family Medicine | Admitting: Family Medicine

## 2016-09-14 ENCOUNTER — Encounter (HOSPITAL_COMMUNITY): Payer: Self-pay

## 2016-09-14 DIAGNOSIS — J069 Acute upper respiratory infection, unspecified: Secondary | ICD-10-CM

## 2016-09-14 DIAGNOSIS — B9789 Other viral agents as the cause of diseases classified elsewhere: Secondary | ICD-10-CM

## 2016-09-14 MED ORDER — BENZONATATE 100 MG PO CAPS: 100.0000 mg | ORAL_CAPSULE | Freq: Three times a day (TID) | ORAL | 0 refills | Status: DC

## 2016-09-14 MED ORDER — IPRATROPIUM BROMIDE 0.06 % NA SOLN
2.0000 | Freq: Four times a day (QID) | NASAL | 12 refills | Status: DC
Start: 2016-09-14 — End: 2018-08-19

## 2016-09-14 NOTE — Discharge Instructions (Signed)
You most likely have a viral URI, I advise rest, plenty of fluids and management of symptoms with over the counter medicines. For symptoms you may take Tylenol as needed every 4-6 hours for body aches or fever, not to exceed 4,000 mg a day, Take mucinex or mucinex DM ever 12 hours with a full glass of water, you may use an inhaled steroid such as Flonase, 2 sprays each nostril once a day for congestion, or an antihistamine such as Claritin or Zyrtec once a day. In addition to these medicines, I have prescribed Tessalon for cough, 1 tablet every 8 hours and Ipratropium nasal spray for runny nose. 2 sprays each nostril 4 times a day.  Should your symptoms worsen or fail to resolve, follow up with your primary care provider or return to clinic.

## 2016-09-14 NOTE — ED Triage Notes (Signed)
[place] been [place] since [place]. Did take a ibuprofen. Does has a hx of pneumonia. Feeling tired, said she does have fever, cough, sore throat, chest congestion, sneezing, runny nose.

## 2016-09-14 NOTE — ED Provider Notes (Signed)
CSN: [id number]     Arrival date & time 09/14/16  1023 History   None    Chief Complaint  Patient presents with  . URI   (Consider location/radiation/quality/duration/timing/severity/associated sxs/prior Treatment) 66 year old female presents to clinic with 2-3 day history of head ache, muscle ache, chills, congestion, and runny nose along with sore throat. She has not taken her temperature and is unsure if she has been febrile. She also reports a wet cough with clear sputum. Denies shortness of breath or wheezing.   The history is provided by the patient.  URI    History reviewed. No pertinent past medical history. Past Surgical History:  Procedure Laterality Date  . TONSILLECTOMY     Family History  Problem Relation Age of Onset  . Cancer Father    Social History  Substance Use Topics  . Smoking status: Never Smoker  . Smokeless tobacco: Never Used  . Alcohol use Yes     Comment: social   OB History    No data available     Review of Systems  Reason unable to perform ROS: as covered in HPI.  All other systems reviewed and are negative.   Allergies  Sulfur  Home Medications   Prior to Admission medications   Medication Sig Start Date End Date Taking? Authorizing Provider  Nutritional Supplements (JUICE PLUS FIBRE PO) Take 1 tablet by mouth daily.   Yes Historical Provider, MD  benzonatate (TESSALON) 100 MG capsule Take 1 capsule (100 mg total) by mouth every 8 (eight) hours. 09/14/16   Dorena BodoLawrence Iley Deignan, NP  guaiFENesin (MUCINEX) 600 MG 12 hr tablet Take 2 tablets (1,200 mg total) by mouth 2 (two) times daily. 09/17/12   Laveda Normanhris N Oti, MD  ipratropium (ATROVENT) 0.06 % nasal spray Place 2 sprays into both nostrils 4 (four) times daily. 09/14/16   Dorena BodoLawrence Jawon Dipiero, NP  levofloxacin (LEVAQUIN) 750 MG tablet Take 1 tablet (750 mg total) by mouth daily. 09/17/12   Laveda Normanhris N Oti, MD   Meds Ordered and Administered this Visit  Medications - No data to display  BP (!) 150/49  (BP Location: Left Arm)   Pulse 86   Temp 99.5 F (37.5 C) (Oral)   Resp 20   SpO2 98%  No data found.   Physical Exam  Constitutional: She is oriented to person, place, and time. She appears well-developed and well-nourished. She appears ill. No distress.  HENT:  Head: Normocephalic and atraumatic.  Right Ear: Tympanic membrane and external ear normal.  Left Ear: Tympanic membrane and external ear normal.  Nose: Rhinorrhea present. Right sinus exhibits no maxillary sinus tenderness and no frontal sinus tenderness. Left sinus exhibits no maxillary sinus tenderness and no frontal sinus tenderness.  Mouth/Throat: Uvula is midline, oropharynx is clear and moist and mucous membranes are normal. No oropharyngeal exudate.  Eyes: Pupils are equal, round, and reactive to light.  Neck: Normal range of motion. Neck supple. No JVD present.  Cardiovascular: Normal rate and regular rhythm.   Pulmonary/Chest: Effort normal and breath sounds normal. No respiratory distress. She has no wheezes.  Abdominal: Soft. Bowel sounds are normal. She exhibits no distension. There is no tenderness. There is no guarding.  Lymphadenopathy:       Head (right side): No submental, no submandibular, no tonsillar and no preauricular adenopathy present.       Head (left side): No submental, no submandibular, no tonsillar and no preauricular adenopathy present.    She has no cervical adenopathy.  Neurological: She is alert and oriented to person, place, and time.  Skin: Skin is warm and dry. Capillary refill takes less than 2 seconds. She is not diaphoretic.  Psychiatric: She has a normal mood and affect.  Nursing note and vitals reviewed.   Urgent Care Course     Procedures (including critical care time)  Labs Review Labs Reviewed - No data to display  Imaging Review No results found.   Visual Acuity Review  Right Eye Distance:   Left Eye Distance:   Bilateral Distance:    Right Eye Near:   Left Eye  Near:    Bilateral Near:         MDM   1. Viral URI with cough   You most likely have a viral URI, I advise rest, plenty of fluids and management of symptoms with over the counter medicines. For symptoms you may take Tylenol as needed every 4-6 hours for body aches or fever, not to exceed 4,000 mg a day, Take mucinex or mucinex DM ever 12 hours with a full glass of water, you may use an inhaled steroid such as Flonase, 2 sprays each nostril once a day for congestion, or an antihistamine such as Claritin or Zyrtec once a day. In addition to these medicines, I have prescribed Tessalon for cough, 1 tablet every 8 hours and Ipratropium nasal spray for runny nose. 2 sprays each nostril 4 times a day.  Should your symptoms worsen or fail to resolve, follow up with your primary care provider or return to clinic.      Dorena Bodo, NP 09/14/16 1216

## 2017-01-01 DIAGNOSIS — M8580 Other specified disorders of bone density and structure, unspecified site: Secondary | ICD-10-CM | POA: Diagnosis not present

## 2017-01-01 DIAGNOSIS — Z0000 Encounter for general adult medical examination without abnormal findings: Secondary | ICD-10-CM | POA: Diagnosis not present

## 2017-01-01 DIAGNOSIS — Z23 Encounter for immunization: Secondary | ICD-10-CM | POA: Diagnosis not present

## 2017-11-26 ENCOUNTER — Ambulatory Visit (INDEPENDENT_AMBULATORY_CARE_PROVIDER_SITE_OTHER): Payer: Self-pay | Admitting: Family Medicine

## 2017-11-26 VITALS — BP 115/80 | HR 65 | Temp 98.2°F | Resp 16

## 2017-11-26 DIAGNOSIS — W57XXXA Bitten or stung by nonvenomous insect and other nonvenomous arthropods, initial encounter: Principal | ICD-10-CM

## 2017-11-26 DIAGNOSIS — S60464A Insect bite (nonvenomous) of right ring finger, initial encounter: Secondary | ICD-10-CM

## 2017-11-26 MED ORDER — CETIRIZINE HCL 10 MG PO TABS: 10.0000 mg | ORAL_TABLET | Freq: Every day | ORAL | 0 refills | Status: DC

## 2017-11-26 NOTE — Patient Instructions (Signed)
Insect Bite, Adult An insect bite can make your skin red, itchy, and swollen. Some insects can spread disease to people with a bite. However, most insect bites do not lead to disease, and most are not serious. Follow these instructions at home: Bite area care  Do not scratch the bite area.  Keep the bite area clean and dry.  Wash the bite area every day with soap and water as told by your doctor.  Check the bite area every day for signs of infection. Check for: ? More redness, swelling, or pain. ? Fluid or blood. ? Warmth. ? Pus. Managing pain, itching, and swelling  You may put any of these on the bite area as told by your doctor: ? A baking soda paste. ? Cortisone cream. ? Calamine lotion.  If directed, put ice on the bite area. ? Put ice in a plastic bag. ? Place a towel between your skin and the bag. ? Leave the ice on for 20 minutes, 2-3 times a day. Medicines  Take medicines or put medicines on your skin only as told by your doctor.  If you were prescribed an antibiotic medicine, use it as told by your doctor. Do not stop using the antibiotic even if your condition improves. General instructions  Keep all follow-up visits as told by your doctor. This is important. How is this prevented? To help you have a lower risk of insect bites:  When you are outside, wear clothing that covers your arms and legs.  Use insect repellent. The best insect repellents have: ? An active ingredient of DEET, picaridin, oil of lemon eucalyptus (OLE), or IR3535. ? Higher amounts of DEET or another active ingredient than other repellents have.  If your home windows do not have screens, think about putting some in.  Contact a doctor if:  You have more redness, swelling, or pain in the bite area.  You have fluid, blood, or pus coming from the bite area.  The bite area feels warm.  You have a fever. Get help right away if:  You have joint pain.  You have a rash.  You have  shortness of breath.  You feel more tired or sleepy than you normally do.  You have neck pain.  You have a headache.  You feel weaker than you normally do.  You have chest pain.  You have pain in your belly.  You feel sick to your stomach (nauseous) or you throw up (vomit). Summary  An insect bite can make your skin red, itchy, and swollen.  Do not scratch the bite area, and keep it clean and dry.  Ice can help with pain and itching from the bite. This information is not intended to replace advice given to you by your health care provider. Make sure you discuss any questions you have with your health care provider. Document Released: 08/03/2000 Document Revised: 03/08/2016 Document Reviewed: 12/22/2014 Elsevier Interactive Patient Education  2018 [employer]  

## 2017-11-26 NOTE — Progress Notes (Signed)
Molly Sanders is a 67 y.o. female who presents with an insect bite of unknown type that occurred earlier today while taking out the trash. She reports immediate pain and later experience localized swelling. She cleaned the area applied an insect bite gel and took an oral benadryl- she reported that this improved the swelling by 50% but warmth localized to the finger and knuckle and a dull ache to the area and forearm still persist. She is unable to identify what could have caused the bite but a single pinpoint puncture wound is present.   Review of Systems  Constitutional: Negative.   HENT: Negative.   Eyes: Negative.   Respiratory: Negative.   Cardiovascular: Negative.   Gastrointestinal: Negative.   Genitourinary: Negative.   Musculoskeletal: Negative.   Skin:       Right ring finger bite  Neurological: Negative.   Endo/Heme/Allergies: Negative.   Psychiatric/Behavioral: Negative.     O: Vitals:   11/26/17 1911  BP: 115/80  Pulse: 65  Resp: 16  Temp: 98.2 F (36.8 C)  SpO2: 98%   Physical Exam  Constitutional: She appears well-developed.  HENT:  Head: Normocephalic.  Eyes: Pupils are equal, round, and reactive to light.  Skin: Skin is warm. There is erythema.  Dorsal side of right ring finger a small pinpoint puncture near DIP joint - affected area skin is consistent with skin color and mild edema and warmth present but area non fluctuant. No evidence of pus or necrosis on exam- skin in intact outside of small puncture. Patient has full ROM and can bend finger but reports mild pain with this movement.  Vitals reviewed.   A: 1. Insect bite of right ring finger, initial encounter    P: 1. Insect bite of right ring finger, initial encounter Discussed treatment plan of ice, and watchful waiting and warning signs of when to seek additional/emergent care. [facility] staff will perform a 48 hours follow up call. Discussed oral antihistamine use of zyrtec. Patient agrees with  POC and verbalized understanding. Other orders - diphenhydrAMINE (BENADRYL) 25 MG tablet; Take 25 mg by mouth every 6 (six) hours as needed. - Multiple Vitamin (MULTIVITAMIN) capsule; Take 1 capsule by mouth daily. - cetirizine (ZYRTEC) 10 MG tablet; Take 1 tablet (10 mg total) by mouth at bedtime for 10 days.

## 2017-11-28 ENCOUNTER — Telehealth: Payer: Self-pay

## 2017-11-28 NOTE — Telephone Encounter (Signed)
I left a message asking the patient to call us back. 

## 2018-07-16 DIAGNOSIS — H52203 Unspecified astigmatism, bilateral: Secondary | ICD-10-CM | POA: Diagnosis not present

## 2018-08-19 ENCOUNTER — Ambulatory Visit
Admission: EM | Admit: 2018-08-19 | Discharge: 2018-08-19 | Disposition: A | Discharge status: Home / Self Care | Payer: 59 | Attending: Physician Assistant | Admitting: Physician Assistant

## 2018-08-19 DIAGNOSIS — J209 Acute bronchitis, unspecified: Secondary | ICD-10-CM | POA: Insufficient documentation

## 2018-08-19 MED ORDER — BENZONATATE 100 MG PO CAPS: 100.0000 mg | ORAL_CAPSULE | Freq: Three times a day (TID) | ORAL | 0 refills | Status: DC

## 2018-08-19 MED ORDER — AZITHROMYCIN 250 MG PO TABS: 250.0000 mg | ORAL_TABLET | Freq: Every day | ORAL | 0 refills | Status: DC

## 2018-08-19 MED ORDER — IPRATROPIUM BROMIDE 0.06 % NA SOLN: 2.0000 | Freq: Four times a day (QID) | NASAL | 12 refills | Status: DC

## 2018-08-19 NOTE — Discharge Instructions (Signed)
Start azithromycin as directed. Tessalon for cough. Start atrovent nasal spray for nasal congestion/drainage. You can use over the counter nasal saline rinse such as neti pot for nasal congestion. Keep hydrated, your urine should be clear to pale yellow in color. Tylenol/motrin for fever and pain. Monitor for any worsening of symptoms, chest pain, shortness of breath, wheezing, swelling of the throat, follow up for reevaluation.

## 2018-08-19 NOTE — ED Provider Notes (Signed)
[facility]    CSN: [id number] Arrival date & time: 08/19/18  1219     History   Chief Complaint Chief Complaint  Patient presents with  . Nasal Congestion    HPI Molly Sanders is a 67 y.o. female.   [age] yea old female comes in for 1+ week history of URI symptoms. States symptoms first started with rhinorrhea, nasal congestion, cough. For the past few days, has felt more chest congestion with deep cough. Denies chest pain, shortness of breath, palpitations. Denies fever, chills, night sweats. otc cold medication without relief. Never smoker.      History reviewed. No pertinent past medical history.  Patient Active Problem List   Diagnosis Date Noted  . Acute respiratory failure with hypoxia (HCC) 09/15/2012  . PNA (pneumonia) 09/15/2012  . SIRS (systemic inflammatory response syndrome) (HCC) 09/15/2012  . Hypokalemia 09/15/2012  . MUSCLE STRAIN, LEFT PECTORALIS MUSCLE 09/06/2008    Past Surgical History:  Procedure Laterality Date  . TONSILLECTOMY      OB History   No obstetric history on file.      Home Medications    Prior to Admission medications   Medication Sig Start Date End Date Taking? Authorizing Provider  azithromycin (ZITHROMAX) 250 MG tablet Take 1 tablet (250 mg total) by mouth daily. Take first 2 tablets together, then 1 every day until finished. 08/19/18   Cathie HoopsYu, Sophira Rumler V, PA-C  benzonatate (TESSALON) 100 MG capsule Take 1 capsule (100 mg total) by mouth every 8 (eight) hours. 08/19/18   Cathie HoopsYu, Tylesha Gibeault V, PA-C  cetirizine (ZYRTEC) 10 MG tablet Take 1 tablet (10 mg total) by mouth at bedtime for 10 days. 11/26/17 12/06/17  Zachery DauerGraham, Elysa, NP  diphenhydrAMINE (BENADRYL) 25 MG tablet Take 25 mg by mouth every 6 (six) hours as needed.    [provider]  ipratropium (ATROVENT) 0.06 % nasal spray Place 2 sprays into both nostrils 4 (four) times daily. 08/19/18   Cathie HoopsYu, Tonya Carlile V, PA-C  Multiple Vitamin (MULTIVITAMIN) capsule Take 1 capsule by mouth  daily.    [provider]  Nutritional Supplements (JUICE PLUS FIBRE PO) Take 1 tablet by mouth daily.    [provider]    Family History Family History  Problem Relation Age of Onset  . Cancer Father     Social History Social History   Tobacco Use  . Smoking status: Never Smoker  . Smokeless tobacco: Never Used  Substance Use Topics  . Alcohol use: Yes    Comment: social  . Drug use: No     Allergies   Sulfur   Review of Systems Review of Systems  Reason unable to perform ROS: See HPI as above.     Physical Exam Triage Vital Signs ED Triage Vitals [08/19/18 1232]  Enc Vitals Group     BP (!) 155/85     Pulse Rate 70     Resp 18     Temp 97.9 F (36.6 C)     Temp Source Oral     SpO2 97 %     Weight      Height      Head Circumference      Peak Flow      Pain Score 0     Pain Loc      Pain Edu?      Excl. in GC?    No data found.  Updated Vital Signs BP (!) 155/85 (BP Location: Right Arm)   Pulse 70  Temp 97.9 F (36.6 C) (Oral)   Resp 18   SpO2 97%   Physical Exam Constitutional:      General: She is not in acute distress.    Appearance: She is well-developed.  HENT:     Head: Normocephalic and atraumatic.     Right Ear: Tympanic membrane, ear canal and external ear normal. Tympanic membrane is not erythematous or bulging.     Left Ear: Tympanic membrane, ear canal and external ear normal. Tympanic membrane is not erythematous or bulging.     Nose: Nose normal.     Right Sinus: No maxillary sinus tenderness or frontal sinus tenderness.     Left Sinus: No maxillary sinus tenderness or frontal sinus tenderness.     Mouth/Throat:     Pharynx: Uvula midline.  Eyes:     Conjunctiva/sclera: Conjunctivae normal.     Pupils: Pupils are equal, round, and reactive to light.  Neck:     Musculoskeletal: Normal range of motion and neck supple.  Cardiovascular:     Rate and Rhythm: Normal rate and regular rhythm.     Heart  sounds: Normal heart sounds. No murmur. No friction rub. No gallop.   Pulmonary:     Effort: Pulmonary effort is normal. No respiratory distress.     Breath sounds: Normal breath sounds. No stridor. No decreased breath sounds, wheezing, rhonchi or rales.     Comments: Coughing throughout exam.  Lymphadenopathy:     Cervical: No cervical adenopathy.  Skin:    General: Skin is warm and dry.  Neurological:     Mental Status: She is alert and oriented to person, place, and time.  Psychiatric:        Behavior: Behavior normal.        Judgment: Judgment normal.      UC Treatments / Results  Labs (all labs ordered are listed, but only abnormal results are displayed) Labs Reviewed - No data to display  EKG None  Radiology No results found.  Procedures Procedures (including critical care time)  Medications Ordered in UC Medications - No data to display  Initial Impression / Assessment and Plan / UC Course  I have reviewed the triage vital signs and the nursing notes.  Pertinent labs & imaging results that were available during my care of the patient were reviewed by me and considered in my medical decision making (see chart for details).    Will treat for bronchitis with azithromycin. Other symptomatic treatment discussed. Return precautions given. Patient expresses understanding and agrees to plan.  Final Clinical Impressions(s) / UC Diagnoses   Final diagnoses:  Acute bronchitis, unspecified organism    ED Prescriptions    Medication Sig Dispense Auth. Provider   ipratropium (ATROVENT) 0.06 % nasal spray Place 2 sprays into both nostrils 4 (four) times daily. 15 mL Arek Spadafore V, PA-C   azithromycin (ZITHROMAX) 250 MG tablet Take 1 tablet (250 mg total) by mouth daily. Take first 2 tablets together, then 1 every day until finished. 6 tablet Kees Idrovo V, PA-C   benzonatate (TESSALON) 100 MG capsule Take 1 capsule (100 mg total) by mouth every 8 (eight) hours. 21 capsule Threasa AlphaYu, Storm Sovine  V, PA-C        America Sandall V, [place] JerseyPA-C 08/19/18 1326

## 2018-08-19 NOTE — ED Triage Notes (Signed)
Pt c/o head and chest congestion for over a week

## 2018-08-27 DIAGNOSIS — F411 Generalized anxiety disorder: Secondary | ICD-10-CM | POA: Diagnosis not present

## 2018-09-10 DIAGNOSIS — F411 Generalized anxiety disorder: Secondary | ICD-10-CM | POA: Diagnosis not present

## 2018-09-24 ENCOUNTER — Encounter: Payer: Self-pay | Admitting: Family Medicine

## 2018-09-24 ENCOUNTER — Ambulatory Visit (INDEPENDENT_AMBULATORY_CARE_PROVIDER_SITE_OTHER): Payer: Self-pay | Admitting: Family Medicine

## 2018-09-24 VITALS — BP 115/72 | HR 76 | Temp 98.6°F | Resp 16 | Wt 136.0 lb

## 2018-09-24 DIAGNOSIS — M545 Low back pain, unspecified: Secondary | ICD-10-CM

## 2018-09-24 DIAGNOSIS — R350 Frequency of micturition: Secondary | ICD-10-CM

## 2018-09-24 DIAGNOSIS — F411 Generalized anxiety disorder: Secondary | ICD-10-CM | POA: Diagnosis not present

## 2018-09-24 DIAGNOSIS — R300 Dysuria: Secondary | ICD-10-CM

## 2018-09-24 LAB — POCT URINALYSIS DIPSTICK
BILIRUBIN UA: NEGATIVE
Glucose, UA: NEGATIVE
Ketones, UA: NEGATIVE
Leukocytes, UA: NEGATIVE
Nitrite, UA: NEGATIVE
Protein, UA: NEGATIVE
RBC UA: NEGATIVE
SPEC GRAV UA: 1.01 (ref 1.010–1.025)
Urobilinogen, UA: 0.2 E.U./dL
pH, UA: 5 (ref 5.0–8.0)

## 2018-09-24 NOTE — Patient Instructions (Addendum)
Urinary Frequency, Adult  Follow up with your PCP who may consider urine culture or evaluation for urinary frequency- continue to monitor back pain for changes in severity- May use topical relief options as needed  Urinary frequency means urinating more often than usual. You may urinate every 1-2 hours even though you drink a normal amount of fluid and do not have a bladder infection or condition. Although you urinate more often than normal, the total amount of urine produced in a day is normal. With urinary frequency, you may have an urgent need to urinate often. The stress and anxiety of needing to find a bathroom quickly can make this urge worse. This condition may go away on its own or you may need treatment at home. Home treatment may include bladder training, exercises, taking medicines, or making changes to your diet. Follow these instructions at home: Bladder health   Keep a bladder diary if told by your health care provider. Keep track of: ? What you eat and drink. ? How often you urinate. ? How much you urinate.  Follow a bladder training program if told by your health care provider. This may include: ? Learning to delay going to the bathroom. ? Double urinating (voiding). This helps if you are not completely emptying your bladder. ? Scheduled voiding.  Do Kegel exercises as told by your health care provider. Kegel exercises strengthen the muscles that help control urination, which may help the condition. Eating and drinking  If told by your health care provider, make diet changes, such as: ? Avoiding caffeine. ? Drinking fewer fluids, especially alcohol. ? Not drinking in the evening. ? Avoiding foods or drinks that may irritate the bladder. These include coffee, tea, soda, artificial sweeteners, citrus, tomato-based foods, and chocolate. ? Eating foods that help prevent or ease constipation. Constipation can make this condition worse. Your health care provider may recommend  that you:  Drink enough fluid to keep your urine pale yellow.  Take over-the-counter or prescription medicines.  Eat foods that are high in fiber, such as beans, whole grains, and fresh fruits and vegetables.  Limit foods that are high in fat and processed sugars, such as fried or sweet foods. General instructions  Take over-the-counter and prescription medicines only as told by your health care provider.  Keep all follow-up visits as told by your health care provider. This is important. Contact a health care provider if:  You start urinating more often.  You feel pain or irritation when you urinate.  You notice blood in your urine.  Your urine looks cloudy.  You develop a fever.  You begin vomiting. Get help right away if:  You are unable to urinate. Summary  Urinary frequency means urinating more often than usual. With urinary frequency, you may urinate every 1-2 hours even though you drink a normal amount of fluid and do not have a bladder infection or other bladder condition.  Your health care provider may recommend that you keep a bladder diary, follow a bladder training program, or make dietary changes.  If told by your health care provider, do Kegel exercises to strengthen the muscles that help control urination.  Take over-the-counter and prescription medicines only as told by your health care provider.  Contact a health care provider if your symptoms do not improve or get worse. This information is not intended to replace advice given to you by your health care provider. Make sure you discuss any questions you have with your health care provider. Document  Released: 06/02/2009 Document Revised: 02/13/2018 Document Reviewed: 02/13/2018 Elsevier Interactive Patient Education  2019 [employer].

## 2018-09-24 NOTE — Progress Notes (Signed)
Molly Sanders is a 68 y.o. female who presents today with 5 days of dysuria and urinary frequency and complaint of low back pain. She has not attempted to treat this condition up to this point. She denies that this is a chronic condition or a recent similar like issue of urinary tract infections or urinary frequency. She is unsure if the back pain is related to her gym activity.  Review of Systems  Constitutional: Negative for chills, fever and malaise/fatigue.  HENT: Negative for congestion, ear discharge, ear pain, sinus pain and sore throat.   Eyes: Negative.   Respiratory: Negative for cough, sputum production and shortness of breath.   Cardiovascular: Negative.  Negative for chest pain.  Gastrointestinal: Negative for abdominal pain, diarrhea, nausea and vomiting.  Genitourinary: Positive for dysuria, frequency and urgency. Negative for flank pain and hematuria.  Musculoskeletal: Positive for back pain. Negative for myalgias.  Skin: Negative.   Neurological: Negative for headaches.  Endo/Heme/Allergies: Negative.   Psychiatric/Behavioral: Negative.     [name] has a current medication list which includes the following prescription(s): calcium gluconate, multivitamin, nutritional supplements, and cetirizine. Also is allergic to sulfa antibiotics and sulfur.  [name]  has no past medical history on file. Also  has a past surgical history that includes Tonsillectomy.    O: Vitals:   09/24/18 1837  BP: 115/72  Pulse: 76  Resp: 16  Temp: 98.6 F (37 C)  SpO2: 97%     Physical Exam Vitals signs reviewed.  Constitutional:      Appearance: She is well-developed. She is not toxic-appearing.  HENT:     Head: Normocephalic.     Right Ear: Hearing, tympanic membrane, ear canal and external ear normal.     Left Ear: Hearing, tympanic membrane, ear canal and external ear normal.     Nose: Nose normal.     Mouth/Throat:     Pharynx: Uvula midline.  Neck:     Musculoskeletal:  Normal range of motion and neck supple.  Cardiovascular:     Rate and Rhythm: Normal rate and regular rhythm.     Pulses: Normal pulses.     Heart sounds: Normal heart sounds.  Pulmonary:     Effort: Pulmonary effort is normal.     Breath sounds: Normal breath sounds.  Abdominal:     General: Abdomen is flat. Bowel sounds are normal.     Palpations: Abdomen is soft.     Tenderness: There is no abdominal tenderness. There is no right CVA tenderness, left CVA tenderness, guarding or rebound. Negative signs include Murphy's sign, Rovsing's sign, McBurney's sign, psoas sign and obturator sign.     Hernia: No hernia is present.     Comments: Tenderness with deep palpation but no pain with tapping to bilateral flank pain area brings no response or pain- deep palpation patient states pain is like musculoskeletal. Visually no abnormalities.  Musculoskeletal: Normal range of motion.  Lymphadenopathy:     Head:     Right side of head: No submental or submandibular adenopathy.     Left side of head: No submental or submandibular adenopathy.     Cervical: No cervical adenopathy.  Neurological:     Mental Status: She is alert and oriented to person, place, and time.    A: 1. Dysuria   2. Urinary frequency   3. Acute low back pain without sciatica, unspecified back pain laterality    P: 1. Dysuria Follow up with your PCP who may consider urine  culture or evaluation for urinary frequency. Based on age, length of symptoms, and risk factors this patient would be more appropriate at a location where she can receive comprehensive diagnostic evaluation. Patient appears non toxic with normal vital signs and is encourage to seek further eval in 24-48 hours as discussed and patient v/u.  - POCT urinalysis dipstick Results for orders placed or performed in visit on 09/24/18 (from the past 24 hour(s))  POCT urinalysis dipstick     Status: Normal   Collection Time: 09/24/18  6:52 PM  Result Value Ref  Range   Color, UA LT YELLOW    Clarity, UA CLEAR    Glucose, UA Negative Negative   Bilirubin, UA NEGATIVE    Ketones, UA NEGATIVE    Spec Grav, UA 1.010 1.010 - 1.025   Blood, UA NEGATIVE    pH, UA 5.0 5.0 - 8.0   Protein, UA Negative Negative   Urobilinogen, UA 0.2 0.2 or 1.0 E.U./dL   Nitrite, UA NEGATIVE    Leukocytes, UA Negative Negative   Appearance     Odor      2. Urinary frequency  3. Acute low back pain without sciatica, unspecified back pain laterality  Continue to monitor back pain for changes in severity- no acute exam finding- UA negative. May use topical relief options as needed   Discussed with patient exam findings, suspected diagnosis etiology and  reviewed recommended treatment plan and follow up, including complications and indications for urgent medical follow up and evaluation. Medications including use and indications reviewed with patient. Patient provided relevant patient education on diagnosis and/or relevant related condition that were discussed and reviewed with patient at discharge. Patient verbalized understanding of information provided and agrees with plan of care (POC), all questions answered.

## 2018-09-26 ENCOUNTER — Telehealth: Payer: Self-pay

## 2018-09-26 NOTE — Telephone Encounter (Signed)
Patient did not answer the phone.

## 2018-10-08 DIAGNOSIS — F411 Generalized anxiety disorder: Secondary | ICD-10-CM | POA: Diagnosis not present

## 2018-10-09 DIAGNOSIS — L720 Epidermal cyst: Secondary | ICD-10-CM | POA: Diagnosis not present

## 2018-10-22 DIAGNOSIS — F411 Generalized anxiety disorder: Secondary | ICD-10-CM | POA: Diagnosis not present

## 2018-11-04 DIAGNOSIS — L723 Sebaceous cyst: Secondary | ICD-10-CM | POA: Diagnosis not present

## 2019-05-07 DIAGNOSIS — F411 Generalized anxiety disorder: Secondary | ICD-10-CM | POA: Diagnosis not present

## 2019-05-20 DIAGNOSIS — F411 Generalized anxiety disorder: Secondary | ICD-10-CM | POA: Diagnosis not present

## 2019-06-04 DIAGNOSIS — F411 Generalized anxiety disorder: Secondary | ICD-10-CM | POA: Diagnosis not present

## 2019-07-02 DIAGNOSIS — F411 Generalized anxiety disorder: Secondary | ICD-10-CM | POA: Diagnosis not present

## 2019-07-21 DIAGNOSIS — F411 Generalized anxiety disorder: Secondary | ICD-10-CM | POA: Diagnosis not present

## 2019-07-23 DIAGNOSIS — H524 Presbyopia: Secondary | ICD-10-CM | POA: Diagnosis not present

## 2019-07-23 DIAGNOSIS — H5203 Hypermetropia, bilateral: Secondary | ICD-10-CM | POA: Diagnosis not present

## 2019-08-06 DIAGNOSIS — F411 Generalized anxiety disorder: Secondary | ICD-10-CM | POA: Diagnosis not present

## 2019-09-10 DIAGNOSIS — F411 Generalized anxiety disorder: Secondary | ICD-10-CM | POA: Diagnosis not present

## 2019-09-21 DIAGNOSIS — Z03818 Encounter for observation for suspected exposure to other biological agents ruled out: Secondary | ICD-10-CM | POA: Diagnosis not present

## 2019-10-01 DIAGNOSIS — F411 Generalized anxiety disorder: Secondary | ICD-10-CM | POA: Diagnosis not present

## 2019-10-15 DIAGNOSIS — F411 Generalized anxiety disorder: Secondary | ICD-10-CM | POA: Diagnosis not present

## 2019-10-28 DIAGNOSIS — F411 Generalized anxiety disorder: Secondary | ICD-10-CM | POA: Diagnosis not present

## 2019-11-04 DIAGNOSIS — Z03818 Encounter for observation for suspected exposure to other biological agents ruled out: Secondary | ICD-10-CM | POA: Diagnosis not present

## 2019-11-18 DIAGNOSIS — Z03818 Encounter for observation for suspected exposure to other biological agents ruled out: Secondary | ICD-10-CM | POA: Diagnosis not present

## 2019-11-26 DIAGNOSIS — F411 Generalized anxiety disorder: Secondary | ICD-10-CM | POA: Diagnosis not present

## 2019-12-02 DIAGNOSIS — Z03818 Encounter for observation for suspected exposure to other biological agents ruled out: Secondary | ICD-10-CM | POA: Diagnosis not present

## 2019-12-09 DIAGNOSIS — Z03818 Encounter for observation for suspected exposure to other biological agents ruled out: Secondary | ICD-10-CM | POA: Diagnosis not present

## 2019-12-10 DIAGNOSIS — F411 Generalized anxiety disorder: Secondary | ICD-10-CM | POA: Diagnosis not present

## 2019-12-16 DIAGNOSIS — Z03818 Encounter for observation for suspected exposure to other biological agents ruled out: Secondary | ICD-10-CM | POA: Diagnosis not present

## 2019-12-17 DIAGNOSIS — Z23 Encounter for immunization: Secondary | ICD-10-CM | POA: Diagnosis not present

## 2019-12-23 DIAGNOSIS — Z03818 Encounter for observation for suspected exposure to other biological agents ruled out: Secondary | ICD-10-CM | POA: Diagnosis not present

## 2020-05-17 ENCOUNTER — Other Ambulatory Visit: Payer: 59

## 2020-05-17 DIAGNOSIS — Z20822 Contact with and (suspected) exposure to covid-19: Secondary | ICD-10-CM | POA: Diagnosis not present

## 2020-05-18 LAB — NOVEL CORONAVIRUS, NAA: SARS-CoV-2, NAA: NOT DETECTED

## 2020-05-18 LAB — SARS-COV-2, NAA 2 DAY TAT

## 2020-08-01 DIAGNOSIS — H5203 Hypermetropia, bilateral: Secondary | ICD-10-CM | POA: Diagnosis not present

## 2020-09-03 DIAGNOSIS — Z20828 Contact with and (suspected) exposure to other viral communicable diseases: Secondary | ICD-10-CM | POA: Diagnosis not present

## 2020-09-04 DIAGNOSIS — Z20828 Contact with and (suspected) exposure to other viral communicable diseases: Secondary | ICD-10-CM | POA: Diagnosis not present

## 2020-09-24 DIAGNOSIS — Z20828 Contact with and (suspected) exposure to other viral communicable diseases: Secondary | ICD-10-CM | POA: Diagnosis not present

## 2021-08-04 DIAGNOSIS — H524 Presbyopia: Secondary | ICD-10-CM | POA: Diagnosis not present

## 2022-06-05 ENCOUNTER — Other Ambulatory Visit (HOSPITAL_BASED_OUTPATIENT_CLINIC_OR_DEPARTMENT_OTHER): Payer: Self-pay

## 2022-06-05 MED ORDER — INFLUENZA VAC A&B SA ADJ QUAD 0.5 ML IM PRSY
PREFILLED_SYRINGE | INTRAMUSCULAR | 0 refills | Status: DC
  Filled 2022-06-05: qty 0.5, 1d supply, fill #0

## 2022-06-14 DIAGNOSIS — S0990XA Unspecified injury of head, initial encounter: Secondary | ICD-10-CM | POA: Diagnosis not present

## 2022-06-14 DIAGNOSIS — W19XXXA Unspecified fall, initial encounter: Secondary | ICD-10-CM | POA: Diagnosis not present

## 2022-06-14 DIAGNOSIS — R4182 Altered mental status, unspecified: Secondary | ICD-10-CM | POA: Diagnosis not present

## 2022-06-14 DIAGNOSIS — M47812 Spondylosis without myelopathy or radiculopathy, cervical region: Secondary | ICD-10-CM | POA: Diagnosis not present

## 2022-06-14 DIAGNOSIS — S065XAA Traumatic subdural hemorrhage with loss of consciousness status unknown, initial encounter: Secondary | ICD-10-CM | POA: Diagnosis not present

## 2022-06-14 DIAGNOSIS — F10920 Alcohol use, unspecified with intoxication, uncomplicated: Secondary | ICD-10-CM | POA: Diagnosis not present

## 2022-06-14 DIAGNOSIS — R451 Restlessness and agitation: Secondary | ICD-10-CM | POA: Diagnosis not present

## 2022-06-14 DIAGNOSIS — D72829 Elevated white blood cell count, unspecified: Secondary | ICD-10-CM | POA: Diagnosis not present

## 2022-06-14 DIAGNOSIS — F10929 Alcohol use, unspecified with intoxication, unspecified: Secondary | ICD-10-CM | POA: Diagnosis not present

## 2022-06-14 DIAGNOSIS — S066X9A Traumatic subarachnoid hemorrhage with loss of consciousness of unspecified duration, initial encounter: Secondary | ICD-10-CM | POA: Diagnosis not present

## 2022-06-14 DIAGNOSIS — S01112A Laceration without foreign body of left eyelid and periocular area, initial encounter: Secondary | ICD-10-CM | POA: Diagnosis not present

## 2022-06-14 DIAGNOSIS — F10129 Alcohol abuse with intoxication, unspecified: Secondary | ICD-10-CM | POA: Diagnosis not present

## 2022-06-14 DIAGNOSIS — S065X9A Traumatic subdural hemorrhage with loss of consciousness of unspecified duration, initial encounter: Secondary | ICD-10-CM | POA: Diagnosis not present

## 2022-06-14 DIAGNOSIS — R519 Headache, unspecified: Secondary | ICD-10-CM | POA: Diagnosis not present

## 2022-06-14 DIAGNOSIS — F17200 Nicotine dependence, unspecified, uncomplicated: Secondary | ICD-10-CM | POA: Diagnosis not present

## 2022-06-14 DIAGNOSIS — Z882 Allergy status to sulfonamides status: Secondary | ICD-10-CM | POA: Diagnosis not present

## 2022-06-14 DIAGNOSIS — S066XAA Traumatic subarachnoid hemorrhage with loss of consciousness status unknown, initial encounter: Secondary | ICD-10-CM | POA: Diagnosis not present

## 2022-06-14 DIAGNOSIS — I6200 Nontraumatic subdural hemorrhage, unspecified: Secondary | ICD-10-CM | POA: Diagnosis not present

## 2022-06-14 DIAGNOSIS — S060XAA Concussion with loss of consciousness status unknown, initial encounter: Secondary | ICD-10-CM | POA: Insufficient documentation

## 2022-06-28 DIAGNOSIS — S0181XA Laceration without foreign body of other part of head, initial encounter: Secondary | ICD-10-CM | POA: Diagnosis not present

## 2022-06-28 DIAGNOSIS — S060X0A Concussion without loss of consciousness, initial encounter: Secondary | ICD-10-CM | POA: Diagnosis not present

## 2022-07-05 DIAGNOSIS — S0990XA Unspecified injury of head, initial encounter: Secondary | ICD-10-CM | POA: Diagnosis not present

## 2022-08-06 DIAGNOSIS — H524 Presbyopia: Secondary | ICD-10-CM | POA: Diagnosis not present

## 2022-08-31 DIAGNOSIS — J029 Acute pharyngitis, unspecified: Secondary | ICD-10-CM | POA: Diagnosis not present

## 2022-09-25 ENCOUNTER — Other Ambulatory Visit: Payer: Self-pay

## 2022-10-02 ENCOUNTER — Other Ambulatory Visit (HOSPITAL_COMMUNITY): Payer: Self-pay

## 2023-01-22 DIAGNOSIS — L218 Other seborrheic dermatitis: Secondary | ICD-10-CM | POA: Diagnosis not present

## 2023-02-07 DIAGNOSIS — H1789 Other corneal scars and opacities: Secondary | ICD-10-CM | POA: Diagnosis not present

## 2023-02-07 DIAGNOSIS — H2513 Age-related nuclear cataract, bilateral: Secondary | ICD-10-CM | POA: Diagnosis not present

## 2023-02-12 ENCOUNTER — Telehealth: Payer: Self-pay | Admitting: Family Medicine

## 2023-02-12 NOTE — Telephone Encounter (Signed)
Patient contacted the office and stated her brother Marliss Czar is one of Dr. Lianne Bushy patients, states Dr. Para March agreed to take his sister on as a new patient. Just wanted to confirm this before I schedule, okay to make appt?

## 2023-02-13 NOTE — Telephone Encounter (Signed)
Please schedule with me. Thanks!

## 2023-02-14 NOTE — Telephone Encounter (Signed)
Spoke to pt, scheduled ov for 7/18

## 2023-03-07 ENCOUNTER — Ambulatory Visit: Payer: Commercial Managed Care - PPO | Admitting: Family Medicine

## 2023-03-07 ENCOUNTER — Encounter: Payer: Self-pay | Admitting: Family Medicine

## 2023-03-07 VITALS — BP 120/60 | HR 61 | Temp 97.6°F | Ht 62.9 in | Wt 130.0 lb

## 2023-03-07 DIAGNOSIS — Z0000 Encounter for general adult medical examination without abnormal findings: Secondary | ICD-10-CM | POA: Diagnosis not present

## 2023-03-07 DIAGNOSIS — H9190 Unspecified hearing loss, unspecified ear: Secondary | ICD-10-CM

## 2023-03-07 DIAGNOSIS — Z7189 Other specified counseling: Secondary | ICD-10-CM

## 2023-03-07 NOTE — Patient Instructions (Addendum)
Ask the front about getting a record release from [facility] and also from Dr. Rana Snare for the last 6 years, ie since 2018.  You can call for a mammogram at the [facility] [address] [place] [address] [place]  I would get a flu shot each fall.   Take care.  Glad to see you.
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
## 2023-03-10 DIAGNOSIS — H9190 Unspecified hearing loss, unspecified ear: Secondary | ICD-10-CM | POA: Insufficient documentation

## 2023-03-10 NOTE — Assessment & Plan Note (Signed)
D/w pt about hearing loss eval.  She can check on insurance/network coverage and let us know about referral preference.

## 2023-03-10 NOTE — Assessment & Plan Note (Signed)
D/w pt about getting records from [place].  Requesting records.  Tetanus unknown, awaiting records.   Flu due this fall.   PNA prev done per patient report.   Shingles prev done.  Covid prev done.   Colonoscopy prev done per patient request, requesting records.   Living will d/w pt.  Daughter Duwayne Heck and brother Peyton Najjar equally designated if patient were incapacitated.   Mammogram due, d/w pt.   DXA prev done, requesting records.  Diet and exercise d/w pt.   HCV screening d/w pt.  May have been with prior labs, requesting records.    We agreed to defer getting labs done at this point.

## 2023-03-10 NOTE — Assessment & Plan Note (Signed)
Living will d/w pt.  Daughter Duwayne Heck and brother Peyton Najjar equally designated if patient were incapacitated.

## 2023-03-13 ENCOUNTER — Telehealth: Payer: Self-pay | Admitting: Family Medicine

## 2023-03-13 DIAGNOSIS — H9190 Unspecified hearing loss, unspecified ear: Secondary | ICD-10-CM

## 2023-03-13 NOTE — Telephone Encounter (Signed)
Done. Thanks.

## 2023-03-13 NOTE — Telephone Encounter (Signed)
Spoke with patient and she stated that she would like to see Dr. Serena Colonel regarding her hearing. She stated that he is located on [address] in [place]. Thank you!

## 2023-03-13 NOTE — Addendum Note (Signed)
Addended by: Joaquim Nam on: 03/13/2023 04:26 PM   Modules accepted: Orders

## 2023-03-14 ENCOUNTER — Encounter: Payer: Self-pay | Admitting: *Deleted

## 2023-05-24 ENCOUNTER — Other Ambulatory Visit: Payer: Self-pay | Admitting: Family Medicine

## 2023-05-24 DIAGNOSIS — Z1211 Encounter for screening for malignant neoplasm of colon: Secondary | ICD-10-CM

## 2023-05-24 DIAGNOSIS — Z1212 Encounter for screening for malignant neoplasm of rectum: Secondary | ICD-10-CM

## 2023-06-12 DIAGNOSIS — H903 Sensorineural hearing loss, bilateral: Secondary | ICD-10-CM | POA: Diagnosis not present

## 2023-06-29 DIAGNOSIS — Z1211 Encounter for screening for malignant neoplasm of colon: Secondary | ICD-10-CM | POA: Diagnosis not present

## 2023-06-29 DIAGNOSIS — Z1212 Encounter for screening for malignant neoplasm of rectum: Secondary | ICD-10-CM | POA: Diagnosis not present

## 2023-07-08 LAB — COLOGUARD: COLOGUARD: NEGATIVE

## 2023-08-06 DIAGNOSIS — H903 Sensorineural hearing loss, bilateral: Secondary | ICD-10-CM | POA: Diagnosis not present

## 2023-08-06 DIAGNOSIS — Z461 Encounter for fitting and adjustment of hearing aid: Secondary | ICD-10-CM | POA: Diagnosis not present

## 2023-09-24 DIAGNOSIS — L218 Other seborrheic dermatitis: Secondary | ICD-10-CM | POA: Diagnosis not present

## 2023-09-24 DIAGNOSIS — L659 Nonscarring hair loss, unspecified: Secondary | ICD-10-CM | POA: Diagnosis not present

## 2024-01-30 ENCOUNTER — Other Ambulatory Visit: Payer: Self-pay

## 2024-02-24 DIAGNOSIS — H524 Presbyopia: Secondary | ICD-10-CM | POA: Diagnosis not present

## 2024-05-25 ENCOUNTER — Encounter: Payer: Self-pay | Admitting: Internal Medicine

## 2024-05-25 ENCOUNTER — Ambulatory Visit (INDEPENDENT_AMBULATORY_CARE_PROVIDER_SITE_OTHER): Admitting: Internal Medicine

## 2024-05-25 VITALS — BP 118/70 | HR 72 | Temp 97.6°F | Ht 62.9 in | Wt 136.0 lb

## 2024-05-25 DIAGNOSIS — L237 Allergic contact dermatitis due to plants, except food: Secondary | ICD-10-CM | POA: Diagnosis not present

## 2024-05-25 MED ORDER — TRIAMCINOLONE ACETONIDE 0.5 % EX OINT: 1.0000 | TOPICAL_OINTMENT | Freq: Two times a day (BID) | CUTANEOUS | 0 refills | Status: DC

## 2024-05-25 MED ORDER — TRIAMCINOLONE ACETONIDE 0.5 % EX OINT: 1.0000 | TOPICAL_OINTMENT | Freq: Two times a day (BID) | CUTANEOUS | 0 refills | Status: AC

## 2024-05-25 MED ORDER — PREDNISONE 10 MG PO TABS: ORAL_TABLET | ORAL | 0 refills | Status: DC

## 2024-05-25 MED ORDER — PREDNISONE 10 MG PO TABS: ORAL_TABLET | ORAL | 0 refills | Status: AC

## 2024-05-25 NOTE — Assessment & Plan Note (Signed)
-   Patient states that she was out gardening approximately 1 week ago and developed a rash over her face as well as her upper arms and neck a couple days after that.  She states that this is similar to a previous rash she had with poison oak and that the area she was gardening and has had poison oak previously -Calamine lotion has not helped -On exam, patient noted erythematous maculopapular rash over her face neck and upper arms with some areas of excoriation and skin peeling -Given the patient has symptoms probably over her face and neck we will start the patient on prednisone taper to help with her symptoms (mid to high potency topical steroids are best avoided over the face and neck) -Will prescribe topical triamcinolone for her arms and shoulders -Patient also advised to use cool compresses as well as possibly oatmeal baths to see if this will help with itching -I have also asked her to try an over-the-counter  antihistamine like loratadine or cetirizine  -Patient to follow-up with her PCP if her symptoms worsen -No further workup at this time

## 2024-05-25 NOTE — Patient Instructions (Signed)
  VISIT SUMMARY: During your visit, we discussed the severe itching and rash you have been experiencing on your face, neck, arms, and shoulders. The symptoms began midweek and have worsened, significantly impacting your sleep. We suspect that the cause is an allergic reaction to poison oak or ivy, which you may have been exposed to while gardening.  YOUR PLAN: -ALLERGIC CONTACT DERMATITIS: Allergic contact dermatitis is a skin reaction that occurs when you come into contact with an allergen, such as poison oak or ivy. This condition causes severe itching and rash. We will treat this with a course of oral prednisone, starting at 40 mg for 3 days, then tapering down to 30 mg for 3 days, 20 mg for 3 days, and finally 10 mg for 3 days before stopping. Prednisone may increase your hunger and energy levels. Additionally, we will use high-potency topical steroids for the affected areas on your shoulders, arms, and legs, but avoid using them on your face and neck. Cool compresses can help alleviate the itching, and over-the-counter non-sedating antihistamines like cetirizine  or loratadine are recommended.  INSTRUCTIONS: Please follow the prescribed prednisone tapering schedule and use the high-potency topical steroids as directed. If your symptoms do not improve, schedule a follow-up appointment with Doctor Cleatus. Additionally, consider using cool compresses and over-the-counter non-sedating antihistamines to help manage your symptoms.                      Contains text generated by [employer].                                 Contains text generated by [employer].

## 2024-05-25 NOTE — Progress Notes (Signed)
 Acute Office Visit  Subjective:     Patient ID: Molly Sanders, female    DOB: 11-04-1950, 73 y.o.   MRN: [id number]  Chief Complaint  Patient presents with   Acute Visit    Poison Oak on face, neck, arms and legs   Discussed the use of AI scribe software for clinical note transcription with the patient, who gave verbal consent to proceed.  History of Present Illness Molly Sanders is a 73 year old female who presents with severe itching on her face, neck, arms, and shoulders.  Pruritus and rash - Severe pruritus began midweek (Wednesday or Thursday) while out of town - Initial onset on face, subsequently spreading to neck, arms, shoulders, and other areas - Pruritus is severe and has worsened over the weekend - Significant impact on sleep due to itching - No pruritus on the back of the neck - Skin peeling present, attributed to Calagel drying on the skin - Difficulty avoiding scratching, especially under the neck  Possible allergen exposure - Recent gardening activity a week ago Sunday in an area known for poison oak - Possible exposure to poison oak suspected as etiology - Hands washed after gardening, but face was not washed, raising concern for spread of allergen  Ocular symptoms - Concern for pruritus spreading to eyes - Sensation of swelling around the eyes  Prior similar reactions - History of similar reactions to poison oak or poison ivy, primarily after gardening activities  Treatment attempts - Calagel used at home for symptomatic relief without improvement - No other treatments attempted      Review of Systems  Constitutional: Negative.   HENT: Negative.    Respiratory: Negative.    Cardiovascular: Negative.   Gastrointestinal: Negative.   Musculoskeletal: Negative.   Skin:  Positive for itching and rash.  Neurological: Negative.   Psychiatric/Behavioral: Negative.          Objective:    BP 118/70   Pulse 72   Temp 97.6 F (36.4 C)    Ht 5' 2.9 (1.598 m)   Wt 136 lb (61.7 kg)   SpO2 94%   BMI 24.17 kg/m    Physical Exam Constitutional:      Appearance: Normal appearance.  HENT:     Head: Normocephalic and atraumatic.  Cardiovascular:     Rate and Rhythm: Normal rate and regular rhythm.     Heart sounds: Normal heart sounds.  Pulmonary:     Effort: Pulmonary effort is normal.     Breath sounds: Normal breath sounds. No wheezing, rhonchi or rales.  Skin:    Findings: Rash present.     Comments: Erythematous maculopapular rash noted over face neck as well as upper arms with areas of excoriation.  Patient also noted to have peeling skin over the back of her neck as well as the left cheek  Neurological:     Mental Status: She is alert.  Psychiatric:        Mood and Affect: Mood normal.        Behavior: Behavior normal.     No results found for any visits on 05/25/24.      Assessment & Plan:   Problem List Items Addressed This Visit       Musculoskeletal and Integument   Poison oak dermatitis - Primary   - Patient states that she was out gardening approximately 1 week ago and developed a rash over her face as well as her upper arms and neck a  couple days after that.  She states that this is similar to a previous rash she had with poison oak and that the area she was gardening and has had poison oak previously -Calamine lotion has not helped -On exam, patient noted erythematous maculopapular rash over her face neck and upper arms with some areas of excoriation and skin peeling -Given the patient has symptoms probably over her face and neck we will start the patient on prednisone taper to help with her symptoms (mid to high potency topical steroids are best avoided over the face and neck) -Will prescribe topical triamcinolone for her arms and shoulders -Patient also advised to use cool compresses as well as possibly oatmeal baths to see if this will help with itching -I have also asked her to try an  over-the-counter  antihistamine like loratadine or cetirizine  -Patient to follow-up with her PCP if her symptoms worsen -No further workup at this time      Relevant Medications   predniSONE (DELTASONE) 10 MG tablet   triamcinolone ointment (KENALOG) 0.5 %    Meds ordered this encounter  Medications   DISCONTD: predniSONE (DELTASONE) 10 MG tablet    Sig: Take 4 tablets (40 mg total) by mouth daily with breakfast for 3 days, THEN 3 tablets (30 mg total) daily with breakfast for 3 days, THEN 2 tablets (20 mg total) daily with breakfast for 3 days, THEN 1 tablet (10 mg total) daily with breakfast for 3 days.    Dispense:  30 tablet    Refill:  0   DISCONTD: triamcinolone ointment (KENALOG) 0.5 %    Sig: Apply 1 Application topically 2 (two) times daily. Apply to itchy areas.  Do not apply to face or neck    Dispense:  30 g    Refill:  0   predniSONE (DELTASONE) 10 MG tablet    Sig: Take 4 tablets (40 mg total) by mouth daily with breakfast for 3 days, THEN 3 tablets (30 mg total) daily with breakfast for 3 days, THEN 2 tablets (20 mg total) daily with breakfast for 3 days, THEN 1 tablet (10 mg total) daily with breakfast for 3 days.    Dispense:  30 tablet    Refill:  0   triamcinolone ointment (KENALOG) 0.5 %    Sig: Apply 1 Application topically 2 (two) times daily. Apply to itchy areas.  Do not apply to face or neck    Dispense:  30 g    Refill:  0    No follow-ups on file.  Molly Yancey, MD

## 2024-05-29 ENCOUNTER — Telehealth: Payer: Self-pay

## 2024-05-29 NOTE — Telephone Encounter (Signed)
 LM for pt to return call.   Please provide message from provider/office when call is returned from patient.

## 2024-05-29 NOTE — Telephone Encounter (Signed)
 I would wait 10 days after done with prednisone.  Thanks.

## 2024-05-29 NOTE — Telephone Encounter (Signed)
 Called and spoke with pt, she is currently taking a prednisone taper.  Will complete the taper on 06/05/24.  When is it appropriate for her to take her flu shot after completing the course of prednisone.  Thank you!    Copied from CRM [id number]. Topic: Clinical - Medical Advice >> May 28, 2024 12:52 PM Pinkey ORN wrote: Reason for CRM: Requesting A Call Back >> May 28, 2024 12:53 PM Pinkey ORN wrote: Patient is requesting a call back from the nurse, states she's wanting to know if it's alright for her to get the flu vaccine while taking predniSONE (DELTASONE) 10 MG tablet. Please follow up with patient.

## 2024-06-02 NOTE — Telephone Encounter (Signed)
 Left voicemail for patient to return call to office.  Please provide  message from provider/office when call is returned from patient.
